# Patient Record
Sex: Female | Born: 1945 | Race: White | Hispanic: No | Marital: Married | Smoking: Never smoker
Health system: Southern US, Academic
[De-identification: ages and names within clinical notes are randomized; demographics above are authoritative.]

## PROBLEM LIST (undated history)

## (undated) DIAGNOSIS — H9041 Sensorineural hearing loss, unilateral, right ear, with unrestricted hearing on the contralateral side: Secondary | ICD-10-CM

## (undated) DIAGNOSIS — R002 Palpitations: Secondary | ICD-10-CM

## (undated) DIAGNOSIS — M4180 Other forms of scoliosis, site unspecified: Secondary | ICD-10-CM

## (undated) DIAGNOSIS — I10 Essential (primary) hypertension: Secondary | ICD-10-CM

## (undated) DIAGNOSIS — I361 Nonrheumatic tricuspid (valve) insufficiency: Secondary | ICD-10-CM

## (undated) DIAGNOSIS — M545 Low back pain, unspecified: Secondary | ICD-10-CM

## (undated) DIAGNOSIS — I7300 Raynaud's syndrome without gangrene: Secondary | ICD-10-CM

## (undated) DIAGNOSIS — G8929 Other chronic pain: Secondary | ICD-10-CM

## (undated) DIAGNOSIS — I471 Supraventricular tachycardia, unspecified: Secondary | ICD-10-CM

## (undated) DIAGNOSIS — M810 Age-related osteoporosis without current pathological fracture: Secondary | ICD-10-CM

## (undated) DIAGNOSIS — K589 Irritable bowel syndrome without diarrhea: Secondary | ICD-10-CM

## (undated) DIAGNOSIS — Z681 Body mass index (BMI) 19 or less, adult: Secondary | ICD-10-CM

## (undated) DIAGNOSIS — E039 Hypothyroidism, unspecified: Secondary | ICD-10-CM

## (undated) DIAGNOSIS — M4310 Spondylolisthesis, site unspecified: Secondary | ICD-10-CM

## (undated) DIAGNOSIS — R350 Frequency of micturition: Secondary | ICD-10-CM

## (undated) DIAGNOSIS — M8580 Other specified disorders of bone density and structure, unspecified site: Secondary | ICD-10-CM

## (undated) DIAGNOSIS — E539 Vitamin B deficiency, unspecified: Secondary | ICD-10-CM

## (undated) DIAGNOSIS — M542 Cervicalgia: Secondary | ICD-10-CM

## (undated) HISTORY — DX: Other chronic pain: G89.29

## (undated) HISTORY — DX: Supraventricular tachycardia, unspecified: I47.10

## (undated) HISTORY — DX: Raynaud's syndrome without gangrene: I73.00

## (undated) HISTORY — DX: Low back pain, unspecified: M54.50

## (undated) HISTORY — DX: Vitamin B deficiency, unspecified: E53.9

## (undated) HISTORY — DX: Other forms of scoliosis, site unspecified: M41.80

## (undated) HISTORY — DX: Cervicalgia: M54.2

## (undated) HISTORY — DX: Frequency of micturition: R35.0

## (undated) HISTORY — PX: HX HYSTERECTOMY: SHX81

## (undated) HISTORY — DX: Sensorineural hearing loss, unilateral, right ear, with unrestricted hearing on the contralateral side: H90.41

## (undated) HISTORY — DX: Other specified disorders of bone density and structure, unspecified site: M85.80

## (undated) HISTORY — DX: Palpitations: R00.2

## (undated) HISTORY — DX: Spondylolisthesis, site unspecified: M43.10

## (undated) HISTORY — DX: Supraventricular tachycardia (CMS HCC): I47.1

## (undated) HISTORY — DX: Irritable bowel syndrome, unspecified: K58.9

## (undated) HISTORY — PX: HX HIP REPLACEMENT: SHX124

## (undated) HISTORY — DX: Age-related osteoporosis without current pathological fracture: M81.0

## (undated) HISTORY — DX: Body mass index (BMI) 19.9 or less, adult: Z68.1

## (undated) HISTORY — DX: Nonrheumatic tricuspid (valve) insufficiency: I36.1

## (undated) HISTORY — PX: HX APPENDECTOMY: SHX54

## (undated) HISTORY — PX: COLONOSCOPY: SHX174

## (undated) HISTORY — DX: Essential (primary) hypertension: I10

## (undated) HISTORY — DX: Hypothyroidism, unspecified: E03.9

---

## 1996-09-29 ENCOUNTER — Other Ambulatory Visit (HOSPITAL_COMMUNITY): Payer: Self-pay

## 2020-11-14 IMAGING — MR MRI HIP RT W/O CONTRAST
13 series · 40 of 40 positions shown · IV contrast (gadolinium)
Comparison: None available.

﻿EXAM:  [id number]   MRI HIP RT W/O CONTRAST
INDICATION: Right hip pain.
TECHNIQUE: Multiplanar multisequential MRI of the pelvis and right hip joint was performed without gadolinium contrast.

[Series 4: axial shim · axial · 10.0mm · 3.12mm/px · z∈[-100,+100]mm · 3 of 21 slices shown (1 of 4)]
[im 1/21]
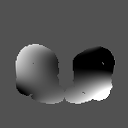
[im 11/21]
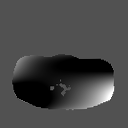
[im 21/21]
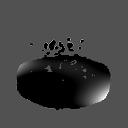

[Series 5: axial shim · axial · 10.0mm · 3.12mm/px · z∈[-100,+100]mm · 2 of 21 slices shown (2 of 4)]
[im 1/21]
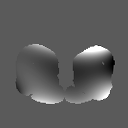
[im 21/21]
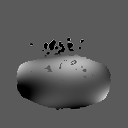

[Series 6: s-map (pelvis) · axial · 14.1mm · 7.03mm/px · z∈[-247,+247]mm · 7 of 72 slices shown (1 of 2)]
[im 1/72]
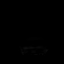
[im 12/72]
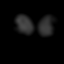
[im 24/72]
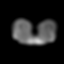
[im 36/72]
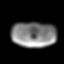
[im 48/72]
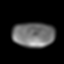
[im 60/72]
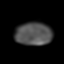
[im 72/72]
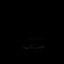

[Series 7: T2 fat-sat · axial · 5.0mm · 0.80mm/px · z∈[-84,+76]mm · 3 of 30 slices shown (1 of 3)]
[im 1/30]
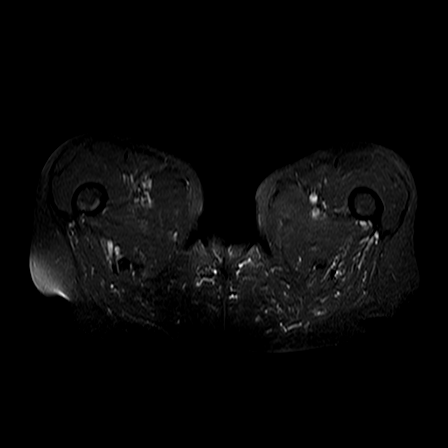
[im 15/30]
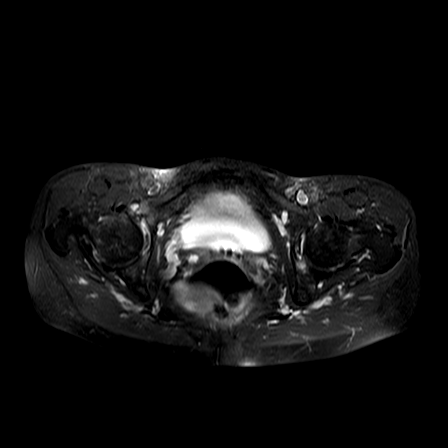
[im 30/30]
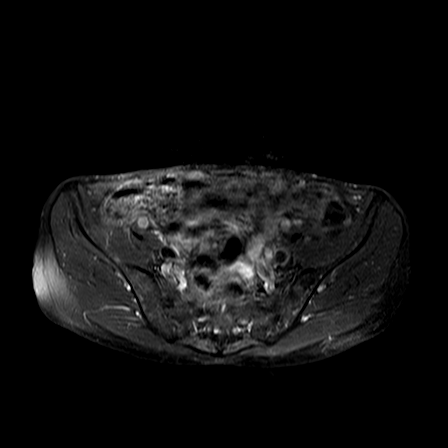

[Series 8: T1 · axial · 5.0mm · 0.70mm/px · z∈[-84,+76]mm · 3 of 30 slices shown (1 of 3)]
[im 1/30]
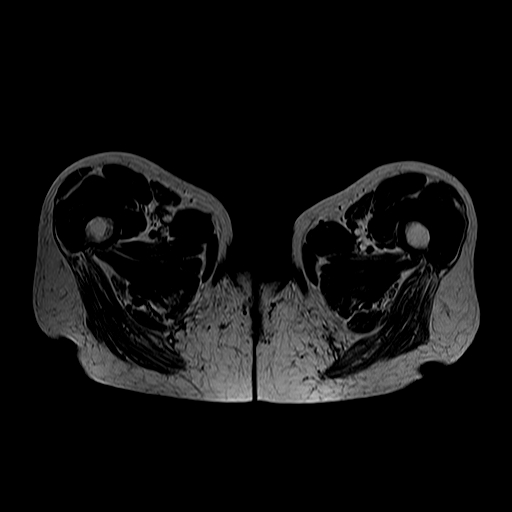
[im 15/30]
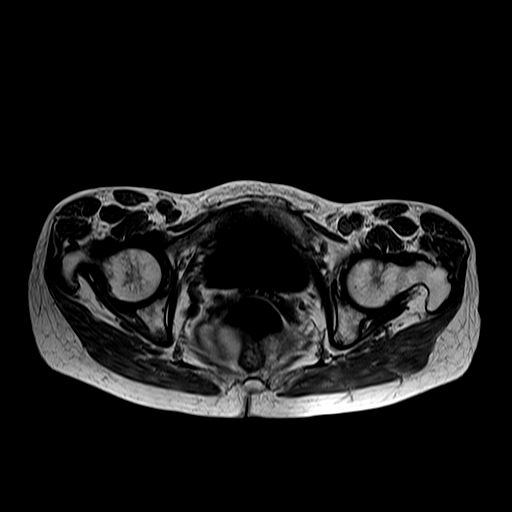
[im 30/30]
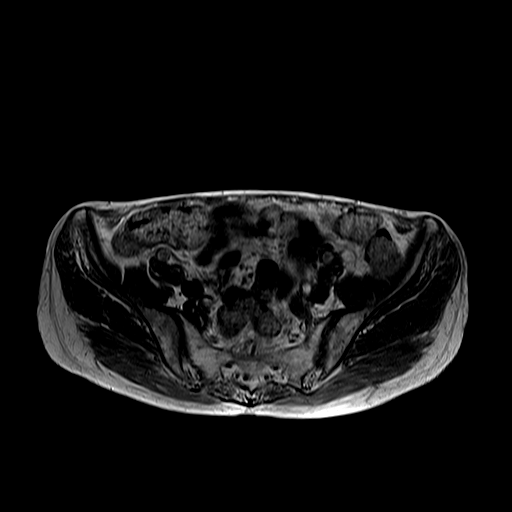

[Series 9: STIR · axial · 5.0mm · 0.70mm/px · z∈[-84,+76]mm · 3 of 30 slices shown]
[im 1/30]
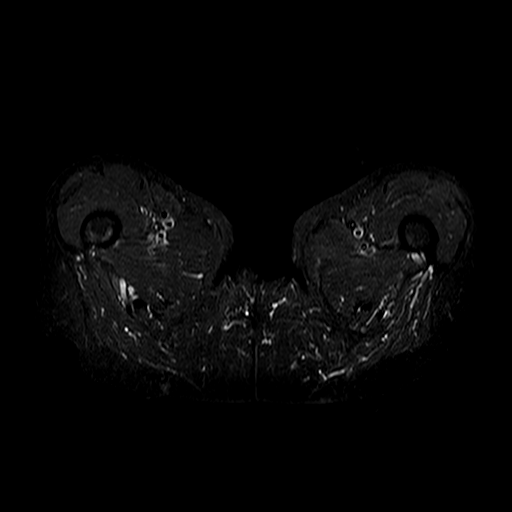
[im 15/30]
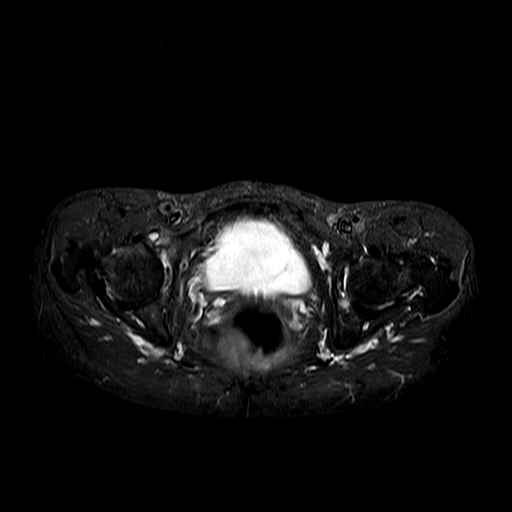
[im 30/30]
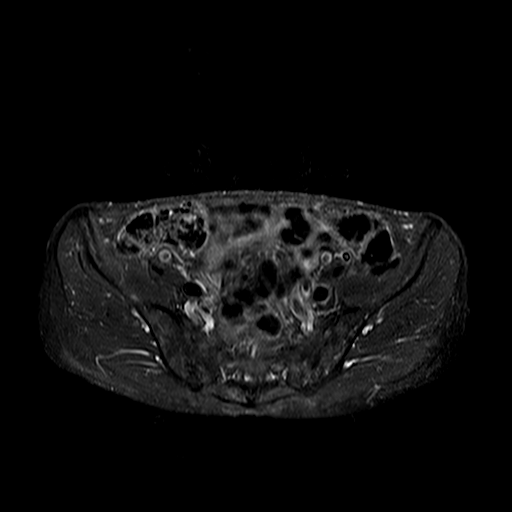

[Series 10: T1 · coronal · 4.0mm · 0.78mm/px · 2 of 20 slices shown (2 of 3)]
[im 1/20]
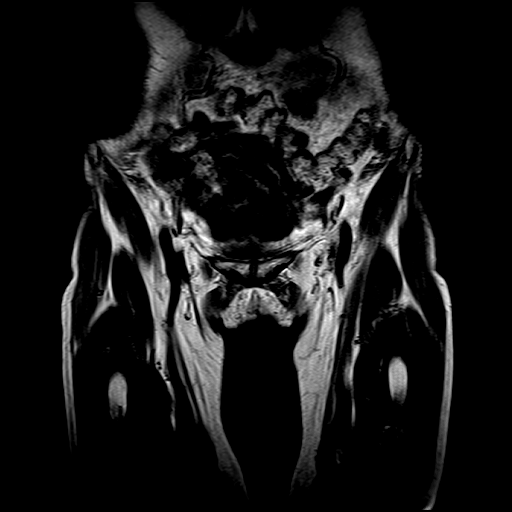
[im 20/20]
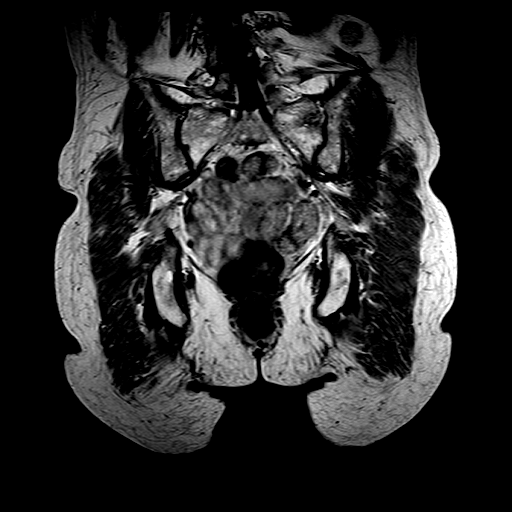

[Series 11: T2 fat-sat · coronal · 4.0mm · 0.78mm/px · 2 of 20 slices shown (2 of 3)]
[im 1/20]
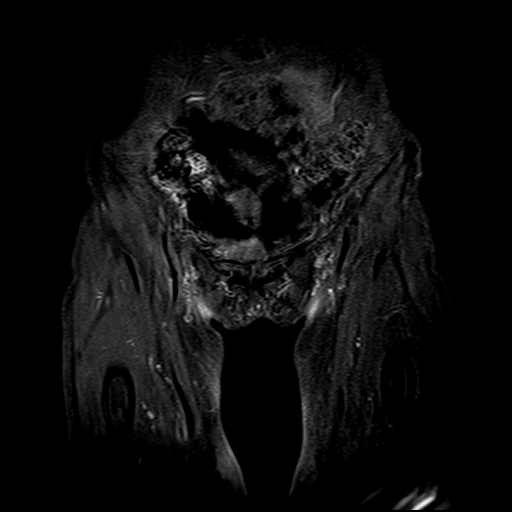
[im 20/20]
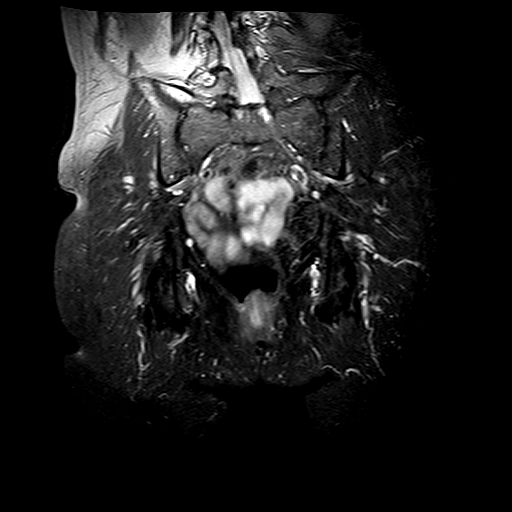

[Series 12: axial shim · axial · 10.0mm · 3.12mm/px · z∈[-100,+100]mm · 2 of 21 slices shown (3 of 4)]
[im 1/21]
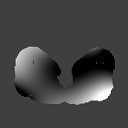
[im 21/21]
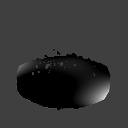

[Series 13: axial shim · axial · 10.0mm · 3.12mm/px · z∈[-100,+100]mm · 2 of 21 slices shown (4 of 4)]
[im 1/21]
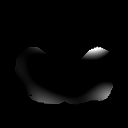
[im 21/21]
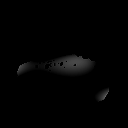

[Series 14: s-map (pelvis) · axial · 14.1mm · 7.03mm/px · z∈[-247,+247]mm · 7 of 72 slices shown (2 of 2)]
[im 1/72]
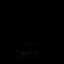
[im 12/72]
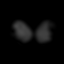
[im 24/72]
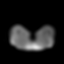
[im 36/72]
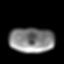
[im 48/72]
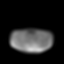
[im 60/72]
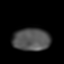
[im 72/72]
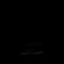

[Series 15: T1 · sagittal · 5.0mm · 0.62mm/px · 2 of 20 slices shown (3 of 3)]
[im 1/20]
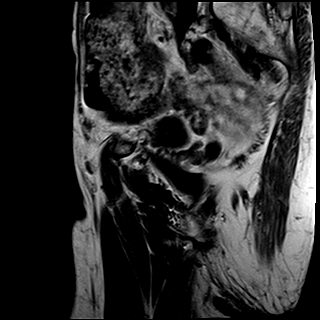
[im 20/20]
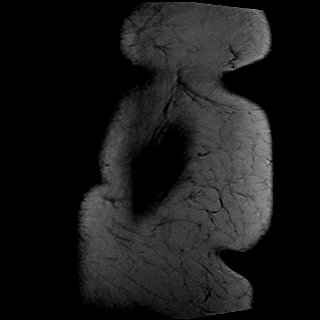

[Series 16: T2 fat-sat · sagittal · 4.5mm · 0.78mm/px · 2 of 20 slices shown (3 of 3)]
[im 1/20]
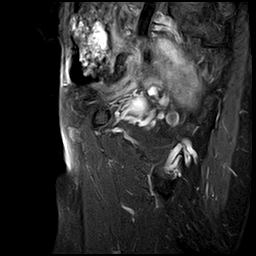
[im 20/20]
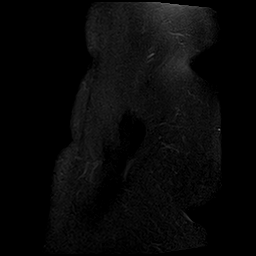

[40 of 40 positions shown; findings below may reference images not displayed]

FINDINGS: Bone marrow signal intensity is normal. There is no acute fracture or subluxation. Near complete loss of articular cartilage is seen within the right hip with extensive subarticular cystic changes within the superior and anterior right acetabulum. Moderate edema is also noted within the posterior acetabulum. There is no significant hip effusion on either side. There is no iliopsoas or greater trochanteric bursitis. Muscles and soft tissues about the pelvic girdle appear unremarkable. Evaluation of pelvic parenchymal structures is limited without definite abnormality.
IMPRESSION: Osteoarthritis of the right hip joint as detailed above.

## 2020-11-27 IMAGING — MR MRI CERVICAL SPINE WITHOUT CONTRAST
4 of 5 series · 23 of 48 positions shown · non-contrast
Comparison: None previous.

﻿EXAM:  [id number]   MRI CERVICAL SPINE WITHOUT CONTRAST
INDICATION: 75-year-old with persistent neck pain and stiffness.  No prior history of cervical spine surgery.
TECHNIQUE: Axial, coronal and sagittal images including T1, T2 and STIR sequences following standard protocol.

[Series 5: T2 · sagittal · 3.0mm · 0.75mm/px · 8 of 15 slices shown (1 of 2)]
[im 1/15]
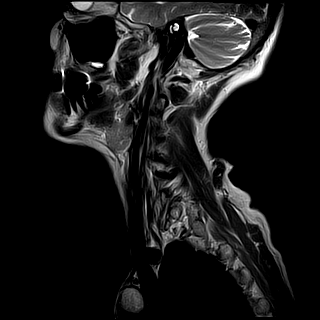
[im 3/15]
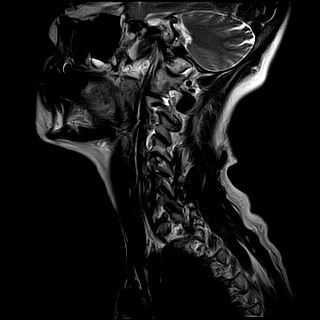
[im 5/15]
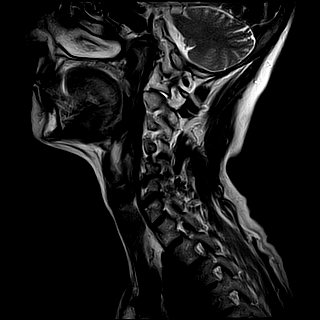
[im 7/15]
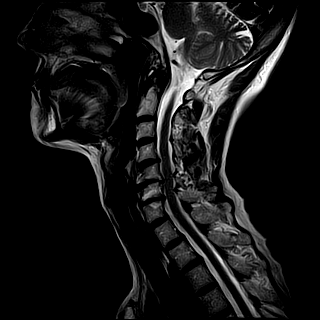
[im 9/15]
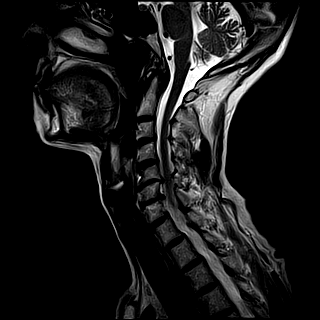
[im 11/15]
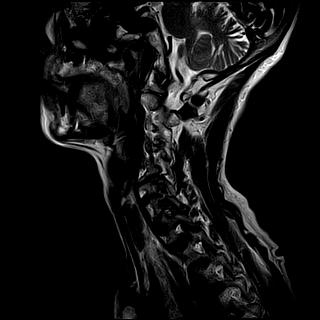
[im 13/15]
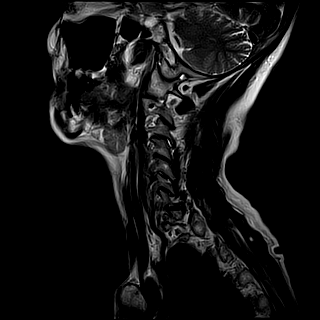
[im 15/15]
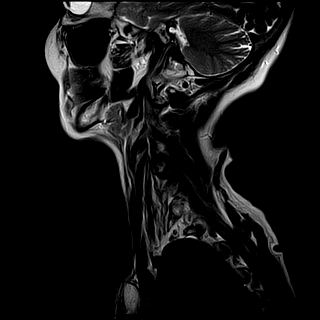

[Series 6: T1 · sagittal · 3.0mm · 0.47mm/px · 3 of 15 slices shown]
[im 2/15]
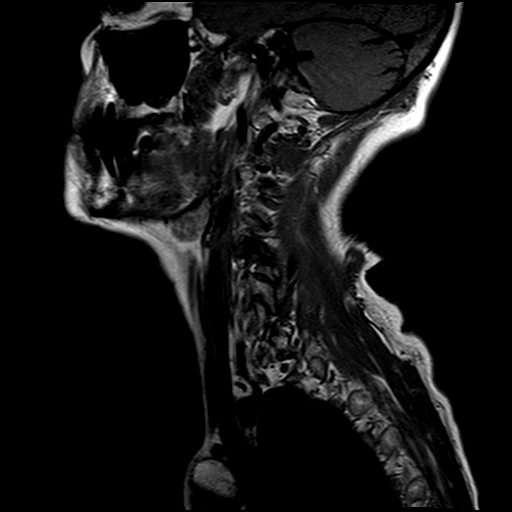
[im 8/15]
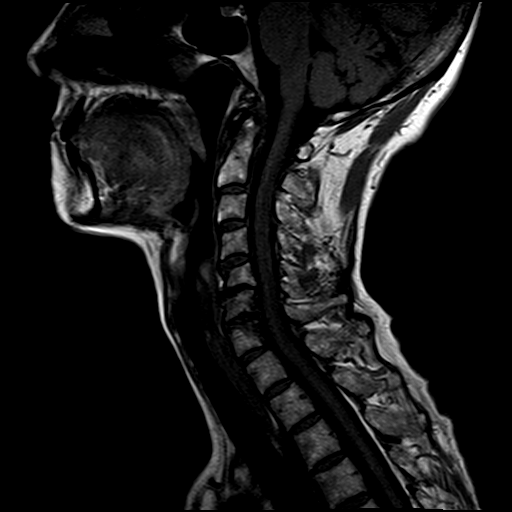
[im 13/15]
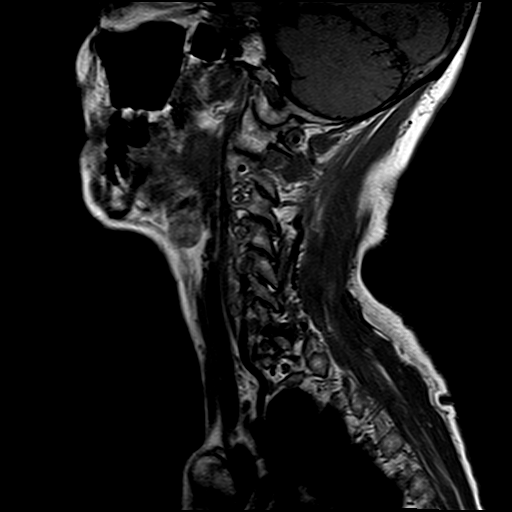

[Series 8: STIR · sagittal · 3.0mm · 0.47mm/px · 3 of 15 slices shown]
[im 2/15]
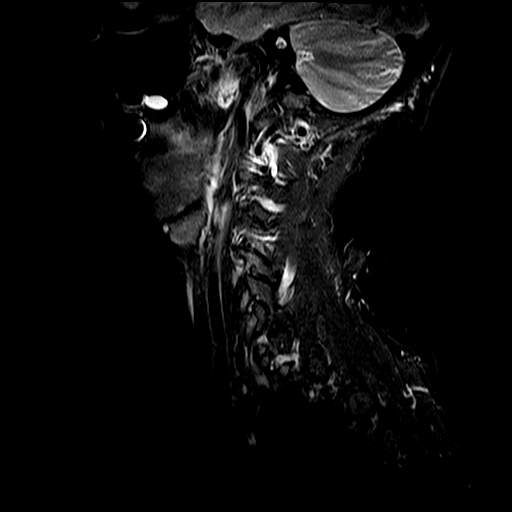
[im 8/15]
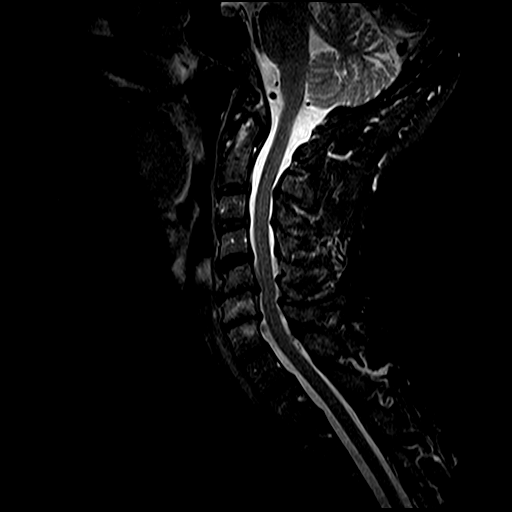
[im 13/15]
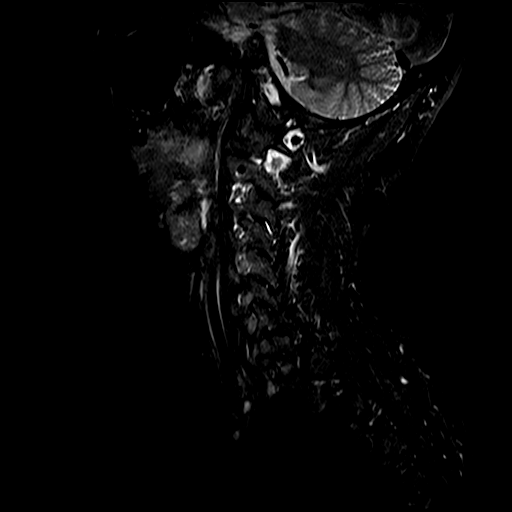

[Series 10: T2 · axial · 3.0mm · 0.39mm/px · z∈[-106,+4]mm · 9 of 18 slices shown (2 of 2)]
[im 1/18]
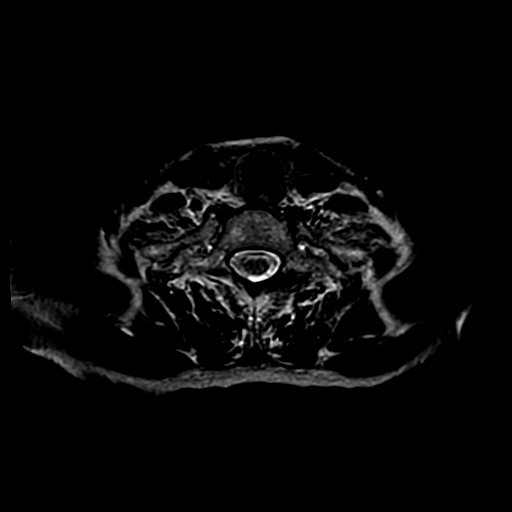
[im 2/18]
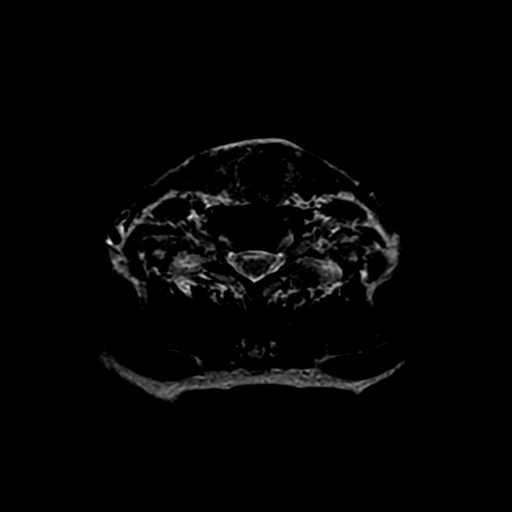
[im 4/18]
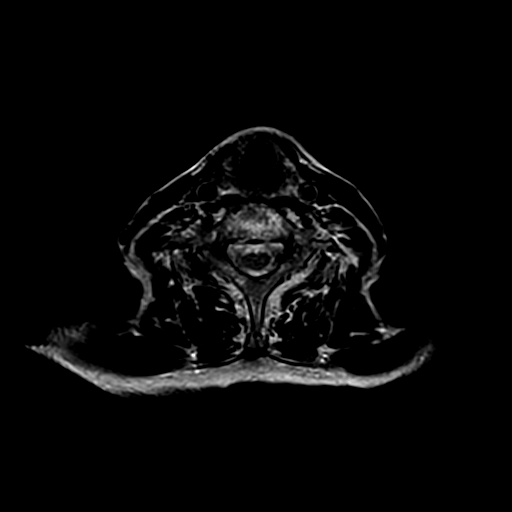
[im 6/18]
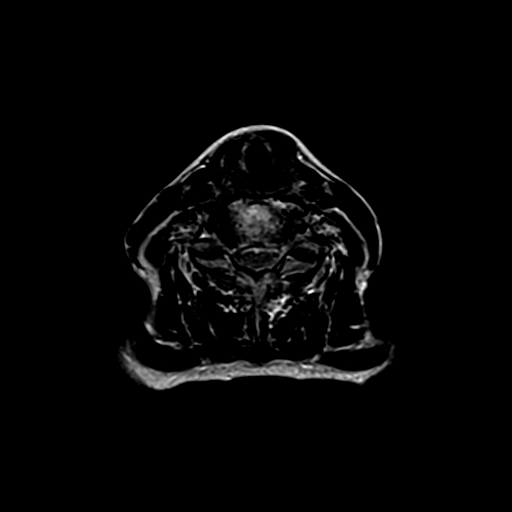
[im 7/18]
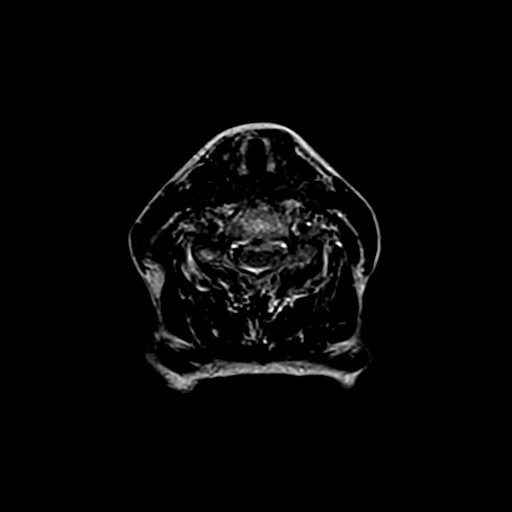
[im 9/18]
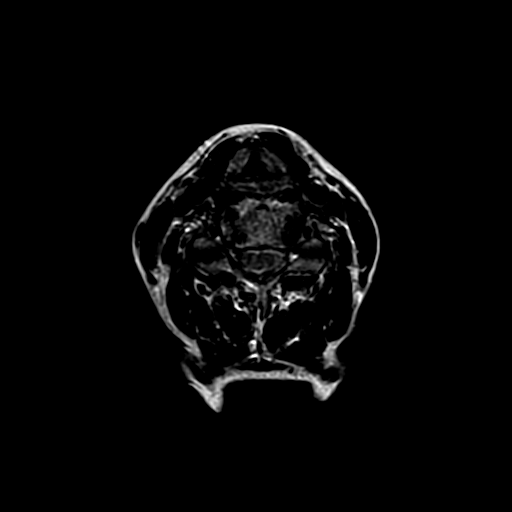
[im 11/18]
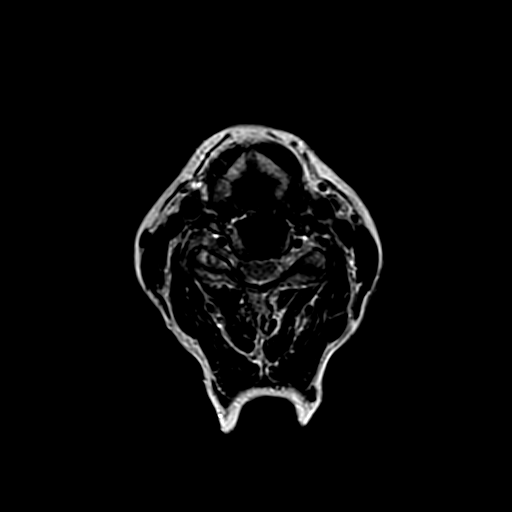
[im 12/18]
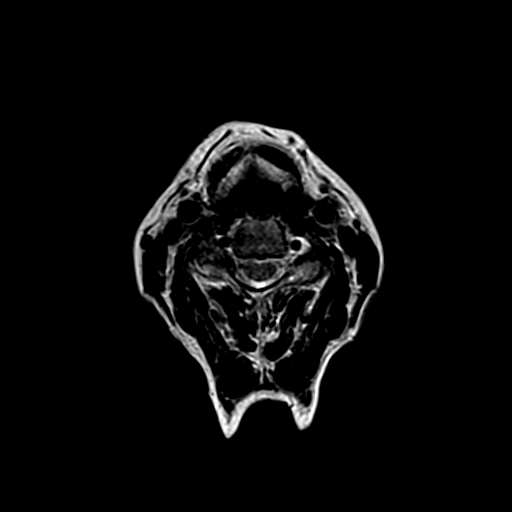
[im 16/18]
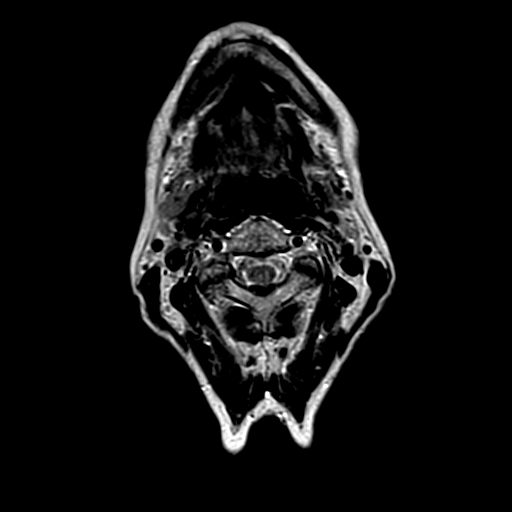

[23 of 48 positions shown; findings below may reference images not displayed]

FINDINGS: No acute bony lesions of cervical vertebrae are seen.  Posterior fossa and foramen magnum structures are normal in sagittal projection. 

At C2-C3 level, no focal disc lesions are seen. 

At C3-C4 level, mild left foraminal narrowing due to degenerative disc osteophyte complex is noted.  

At C4-C5 level, mild right foraminal narrowing due to osteophyte complex is noted.  

At C5-C6 level, prominent disc osteophyte complex on the left side is noted causing significant compromise of left neural foramen impinging on the left C6 nerve root.  Mild compromise of thecal sac is noted in the midline at this level with AP diameter measuring 7.5 mm.  

At C6-C7 level, significant degenerative disc disease is noted with Modic type I inflammatory changes on both sides.  Disc osteophyte complex is causing compromise of both neural foramina at C6-C7 level, right more than the left.  

At C7-T1 level, no focal disc lesions are seen.  Cervical spinal cord shows no focal abnormalities.  Paravertebral soft tissues are unremarkable.
IMPRESSION: 1. No acute bone changes of cervical vertebrae. 

2. At C5-C6 level, prominent disc osteophyte complex on the left side is noted causing significant compromise of left neural foramen impinging on the left C6 nerve root.  Mild compromise of thecal sac is noted in the midline at this level with AP diameter measuring 7.5 mm.  

3. At C6-C7 level, significant degenerative disc disease is noted with Modic type I inflammatory changes on both sides.  Disc osteophyte complex is causing compromise of both neural foramina at C6-C7 level, right more than the left.  

4. No focal lesions of the cervical cord are seen.

## 2021-09-24 ENCOUNTER — Encounter (INDEPENDENT_AMBULATORY_CARE_PROVIDER_SITE_OTHER): Payer: Self-pay | Admitting: Family Medicine

## 2021-09-24 DIAGNOSIS — M542 Cervicalgia: Secondary | ICD-10-CM | POA: Insufficient documentation

## 2021-09-24 DIAGNOSIS — M50122 Cervical disc disorder at C5-C6 level with radiculopathy: Secondary | ICD-10-CM | POA: Insufficient documentation

## 2021-09-24 DIAGNOSIS — M7542 Impingement syndrome of left shoulder: Secondary | ICD-10-CM | POA: Insufficient documentation

## 2021-09-24 DIAGNOSIS — M50123 Cervical disc disorder at C6-C7 level with radiculopathy: Secondary | ICD-10-CM

## 2021-10-04 ENCOUNTER — Other Ambulatory Visit: Payer: Self-pay

## 2021-10-04 ENCOUNTER — Other Ambulatory Visit: Payer: Commercial Managed Care - PPO | Attending: Family Medicine | Admitting: Family Medicine

## 2021-10-04 ENCOUNTER — Ambulatory Visit (INDEPENDENT_AMBULATORY_CARE_PROVIDER_SITE_OTHER): Payer: Commercial Managed Care - PPO

## 2021-10-04 ENCOUNTER — Telehealth (INDEPENDENT_AMBULATORY_CARE_PROVIDER_SITE_OTHER): Payer: Self-pay | Admitting: Family Medicine

## 2021-10-04 DIAGNOSIS — E785 Hyperlipidemia, unspecified: Secondary | ICD-10-CM

## 2021-10-04 DIAGNOSIS — R399 Unspecified symptoms and signs involving the genitourinary system: Secondary | ICD-10-CM | POA: Insufficient documentation

## 2021-10-04 DIAGNOSIS — I10 Essential (primary) hypertension: Secondary | ICD-10-CM | POA: Insufficient documentation

## 2021-10-04 LAB — URINALYSIS, MACROSCOPIC
BILIRUBIN: NEGATIVE mg/dL
BLOOD: NEGATIVE mg/dL
GLUCOSE: NEGATIVE mg/dL
KETONES: NEGATIVE mg/dL
LEUKOCYTES: NEGATIVE WBCs/uL
NITRITE: NEGATIVE
PH: 7 (ref 5.0–9.0)
PROTEIN: NEGATIVE mg/dL
SPECIFIC GRAVITY: 1.014 (ref 1.002–1.030)
UROBILINOGEN: NORMAL mg/dL

## 2021-10-04 LAB — LIPID PANEL
CHOL/HDL RATIO: 2.1
CHOLESTEROL: 177 mg/dL (ref 136–290)
HDL CHOL: 85 mg/dL (ref 23–92)
LDL CALC: 75 mg/dL (ref 0–100)
TRIGLYCERIDES: 83 mg/dL (ref <=150)
VLDL CALC: 17 mg/dL (ref 0–50)

## 2021-10-04 LAB — BASIC METABOLIC PANEL
ANION GAP: 6 mmol/L — ABNORMAL LOW (ref 10–20)
BUN/CREA RATIO: 23 — ABNORMAL HIGH (ref 6–22)
BUN: 19 mg/dL (ref 7–25)
CALCIUM: 9.8 mg/dL (ref 8.6–10.3)
CHLORIDE: 104 mmol/L (ref 98–107)
CO2 TOTAL: 29 mmol/L (ref 21–31)
CREATININE: 0.84 mg/dL (ref 0.60–1.30)
ESTIMATED GFR: 73 mL/min/{1.73_m2} (ref >59)
GLUCOSE: 96 mg/dL (ref 74–109)
OSMOLALITY, CALCULATED: 280 mOsm/kg (ref 270–290)
POTASSIUM: 4.4 mmol/L (ref 3.5–5.1)
SODIUM: 139 mmol/L (ref 136–145)

## 2021-10-04 LAB — CBC
HCT: 42.8 % (ref 37.0–47.0)
HGB: 14.2 g/dL (ref 12.5–16.0)
MCH: 31.4 pg (ref 27.0–32.0)
MCHC: 33 g/dL (ref 32.0–36.0)
MCV: 95.1 fL (ref 78.0–99.0)
MPV: 8.2 fL (ref 7.4–10.4)
PLATELETS: 298 10*3/uL (ref 140–440)
RBC: 4.51 10*6/uL (ref 4.20–5.40)
RDW: 13.2 % (ref 11.6–14.8)
WBC: 5.1 10*3/uL (ref 4.0–10.5)
WBCS UNCORRECTED: 5.1 10*3/uL

## 2021-10-04 LAB — URINALYSIS, MICROSCOPIC: WBCS: 1 /hpf (ref <6)

## 2021-10-04 LAB — HEPATIC FUNCTION PANEL
ALBUMIN/GLOBULIN RATIO: 1.4 (ref 0.8–1.4)
ALBUMIN: 4.3 g/dL (ref 3.5–5.7)
ALKALINE PHOSPHATASE: 94 U/L (ref 34–104)
ALT (SGPT): 25 U/L (ref 7–52)
AST (SGOT): 32 U/L (ref 13–39)
BILIRUBIN DIRECT: 0.2 md/dL (ref <=0.20)
BILIRUBIN TOTAL: 1.2 mg/dL (ref 0.3–1.2)
BILIRUBIN, INDIRECT: 1 mg/dL (ref <=1)
GLOBULIN: 3 (ref 2.9–5.4)
PROTEIN TOTAL: 7.3 g/dL (ref 6.4–8.9)

## 2021-10-04 LAB — THYROID STIMULATING HORMONE (SENSITIVE TSH): TSH: 2.084 u[IU]/mL (ref 0.450–5.330)

## 2021-10-04 NOTE — Telephone Encounter (Addendum)
Has CDM on 10/05/21 - discuss labs then          ----- Message from Mickey Farber, DO sent at 10/04/2021  1:19 PM EST -----  Call patient let her know her labs look good and there does not appear to be any infection in her urine

## 2021-10-05 ENCOUNTER — Ambulatory Visit (INDEPENDENT_AMBULATORY_CARE_PROVIDER_SITE_OTHER): Payer: Commercial Managed Care - PPO | Admitting: Family Medicine

## 2021-10-05 ENCOUNTER — Encounter (INDEPENDENT_AMBULATORY_CARE_PROVIDER_SITE_OTHER): Payer: Self-pay | Admitting: Family Medicine

## 2021-10-05 VITALS — BP 128/80 | HR 67 | Temp 98.5°F | Ht 66.0 in | Wt 103.0 lb

## 2021-10-05 DIAGNOSIS — M810 Age-related osteoporosis without current pathological fracture: Secondary | ICD-10-CM

## 2021-10-05 DIAGNOSIS — G8929 Other chronic pain: Secondary | ICD-10-CM

## 2021-10-05 DIAGNOSIS — I471 Supraventricular tachycardia: Secondary | ICD-10-CM

## 2021-10-05 DIAGNOSIS — Z681 Body mass index (BMI) 19 or less, adult: Secondary | ICD-10-CM

## 2021-10-05 DIAGNOSIS — M545 Low back pain, unspecified: Secondary | ICD-10-CM

## 2021-10-05 DIAGNOSIS — E559 Vitamin D deficiency, unspecified: Secondary | ICD-10-CM

## 2021-10-05 DIAGNOSIS — E538 Deficiency of other specified B group vitamins: Secondary | ICD-10-CM

## 2021-10-05 DIAGNOSIS — E039 Hypothyroidism, unspecified: Secondary | ICD-10-CM | POA: Insufficient documentation

## 2021-10-05 DIAGNOSIS — I10 Essential (primary) hypertension: Secondary | ICD-10-CM

## 2021-10-05 NOTE — Progress Notes (Signed)
FAMILY MEDICINE, MEDICAL OFFICE BUILDING  [address]  [place] [address]       Name: Angela Benson MRN:  [id number]   Date: 10/05/2021 Age: 76 y.o.          Provider: Mickey Farber, DO    Reason for visit: Follow Up 6 Months      History of Present Illness:  10/05/2021:  This 76 year old female presents for 6 month follow-up to review her labs and get refills on all of her medications.  Her blood pressure is excellent 128/80.  She does have a chronic cough and she is on an ACE-inhibitor and I suggest we change to another medication but she did 1 change at this time.  She denies chest pain or shortness of breath.  She is now going to the fitness center and swimming on a regular basis.  She also is working at [employer].  She has a history of Raynaud's disease and has some redness around her fingers but no red-white bleed changes with the cold.  She has not gained any weight since she was here last.  Her liver enzymes were normal urinalysis normal TSH was 2.084 CBC was normal electrolytes normal with a glucose of 96 BUN 19 creatinine 0.84 her calcium is normal 9.8 GFR was normal.  Total cholesterol was 177 triglycerides 83 HDL 85 and LDL 75.  She also has a sore right great toe had in the past at but today it is okay.  Historical Data    Past Medical History:  Past Medical History:   Diagnosis Date   . Acquired hypothyroidism    . B-complex deficiency    . Body mass index (BMI) of 19.9 or less in adult    . Chronic neck pain    . Dextroscoliosis    . Essential hypertension    . Irritable bowel syndrome    . Low back pain    . Midline low back pain without sciatica    . Non-rheumatic tricuspid valve insufficiency    . Osteopenia    . Osteoporosis    . Palpitations    . Raynaud's syndrome    . Retrolisthesis of vertebrae    . Sensorineural hearing loss (SNHL) of right ear with unrestricted hearing of left ear    . SVT (supraventricular tachycardia) (CMS HCC)    . Urinary frequency          Past  Surgical History:  Past Surgical History:   Procedure Laterality Date   . COLONOSCOPY     . HX APPENDECTOMY     . HX HYSTERECTOMY           Allergies:  Allergies   Allergen Reactions   . Nitrofurantoin Hives/ Urticaria     Flu-like symptoms/hives   . Penicillins Hives/ Urticaria     Hives/rash     Medications:  Current Outpatient Medications   Medication Sig   . Ca carb-Ca gluc-Mg ox-Mg gluco (CALCIUM MAGNESIUM) 500 mg calcium -250 mg Oral Tablet Take 1 Tablet by mouth Once a day   . cholecalciferol, vitamin D3, 50 mcg (2,000 unit) Oral Tablet Take 1 Tablet (2,000 Units total) by mouth Once a day   . coenzyme Q10 (CO Q-10) 100 mg Oral Capsule Take 1 Capsule (100 mg total) by mouth Once a day   . cyanocobalamin (VITAMIN B 12) 1,000 mcg Oral Tablet Take 1 Tablet (1,000 mcg total) by mouth Once a day   . levothyroxine (SYNTHROID) 50 mcg Oral  Tablet Take 1 Tablet (50 mcg total) by mouth Every morning   . lisinopriL (PRINIVIL) 20 mg Oral Tablet Take 1 Tablet (20 mg total) by mouth Once a day     Family History:  Family Medical History:     Problem Relation (Age of Onset)    Arthritis-osteo Father    Congestive Heart Failure Mother    Dementia Mother    Diabetes type II Father    Elevated Lipids Father    Heart Attack Father    Hypertension (High Blood Pressure) Mother, Father    Melanoma Father          Social History:  Social History     Socioeconomic History   . Marital status: Married   Tobacco Use   . Smoking status: Never     Passive exposure: Never   . Smokeless tobacco: Never   Vaping Use   . Vaping Use: Never used           Review of Systems:  Any pertinent Review of Systems as addressed in the HPI above.    Physical Exam:  Vital Signs:  Vitals:    10/05/21 1101   BP: 128/80   Pulse: 67   Temp: 36.9 C (98.5 F)   TempSrc: Skin   SpO2: 95%   Weight: 46.7 kg (103 lb)   Height: 1.676 m (5\' 6" )   BMI: 16.66     Physical Exam  Constitutional:       Appearance: Normal appearance. She is normal weight.   HENT:       Head: Normocephalic and atraumatic.      Right Ear: Tympanic membrane, ear canal and external ear normal.      Left Ear: Tympanic membrane, ear canal and external ear normal.      Nose: Nose normal.      Mouth/Throat:      Mouth: Mucous membranes are moist.      Pharynx: Oropharynx is clear.   Eyes:      Extraocular Movements: Extraocular movements intact.      Conjunctiva/sclera: Conjunctivae normal.      Pupils: Pupils are equal, round, and reactive to light.   Cardiovascular:      Rate and Rhythm: Normal rate and regular rhythm.   Pulmonary:      Effort: Pulmonary effort is normal.      Breath sounds: Normal breath sounds.   Abdominal:      General: Abdomen is flat. Bowel sounds are normal.      Palpations: Abdomen is soft.   Musculoskeletal:         General: Normal range of motion.      Cervical back: Normal range of motion and neck supple.   Skin:     General: Skin is warm and dry.      Capillary Refill: Capillary refill takes less than 2 seconds.   Neurological:      General: No focal deficit present.      Mental Status: She is alert and oriented to person, place, and time. Mental status is at baseline.   Psychiatric:         Mood and Affect: Mood normal.         Behavior: Behavior normal.         Thought Content: Thought content normal.         Judgment: Judgment normal.       Assessment:    ICD-10-CM    1. Acquired hypothyroidism  E03.9  2. Essential hypertension  I10 LIPID PANEL     HEPATIC FUNCTION PANEL     CBC/DIFF     URINALYSIS, MACROSCOPIC AND MICROSCOPIC W/CULTURE REFLEX     BASIC METABOLIC PANEL     THYROID STIMULATING HORMONE (SENSITIVE TSH)      3. Age related osteoporosis, unspecified pathological fracture presence  M81.0       4. SVT (supraventricular tachycardia) (CMS HCC)  I47.1       5. Chronic midline low back pain without sciatica  M54.50 C-REACTIVE PROTEIN(CRP),INFLAMMATION    G89.29       6. Body mass index (BMI) of 19.9 or less in adult  Z68.1       7. B12 deficiency  E53.8 VITAMIN  B12      8. Vitamin D deficiency  E55.9 VITAMIN D 25 TOTAL         Plan:  Orders Placed This Encounter   . LIPID PANEL   . HEPATIC FUNCTION PANEL   . CBC/DIFF   . URINALYSIS, MACROSCOPIC AND MICROSCOPIC W/CULTURE REFLEX   . BASIC METABOLIC PANEL   . THYROID STIMULATING HORMONE (SENSITIVE TSH)   . VITAMIN D 25 TOTAL   . VITAMIN B12   . C-REACTIVE PROTEIN(CRP),INFLAMMATION     Today for 6 months from now I would lipid panel hepatic function panel CBC with diff urinalysis microscopic microscopic basic metabolic panel thyroid-stimulating hormone vitamin-D B12 and CRP.  The patient feels like she has inflammation in her body.  She states that no matter what she does she can not gain weight but she stays extremely active at all times and really does not eat a whole lot of fast food or high carb foods.  Her back pain is still there and she has been using Forensic psychologist which helps much better than that she gets over-the-counter.  Her blood pressure stable we will check vitamin-D and vitamin B12 level in the future thyroid is well controlled and stable she has a history of SVT but not today.  More than 50% of visit was spent counseling and coordinating patient care.  All questions were answered to the satisfaction of the patient.    Return in about 6 months (around 04/07/2022).    Mickey Farber, DO     Portions of this note may be dictated using voice recognition software or a dictation service. Variances in spelling and vocabulary are possible and unintentional. Not all errors are caught/corrected. Please notify the [facility] if any discrepancies are noted or if the meaning of any statement is not clear.

## 2021-10-12 ENCOUNTER — Other Ambulatory Visit: Payer: Self-pay

## 2021-10-24 ENCOUNTER — Other Ambulatory Visit (INDEPENDENT_AMBULATORY_CARE_PROVIDER_SITE_OTHER): Payer: Self-pay | Admitting: Family Medicine

## 2021-11-19 ENCOUNTER — Other Ambulatory Visit (INDEPENDENT_AMBULATORY_CARE_PROVIDER_SITE_OTHER): Payer: Self-pay | Admitting: Family Medicine

## 2021-12-27 ENCOUNTER — Ambulatory Visit (INDEPENDENT_AMBULATORY_CARE_PROVIDER_SITE_OTHER): Payer: Commercial Managed Care - PPO | Admitting: Family Medicine

## 2021-12-27 ENCOUNTER — Other Ambulatory Visit: Payer: Self-pay

## 2021-12-27 ENCOUNTER — Encounter (INDEPENDENT_AMBULATORY_CARE_PROVIDER_SITE_OTHER): Payer: Self-pay | Admitting: Family Medicine

## 2021-12-27 VITALS — BP 127/84 | HR 74 | Temp 98.9°F | Resp 20 | Ht 66.0 in | Wt 103.6 lb

## 2021-12-27 DIAGNOSIS — M50122 Cervical disc disorder at C5-C6 level with radiculopathy: Secondary | ICD-10-CM

## 2021-12-27 DIAGNOSIS — R0982 Postnasal drip: Secondary | ICD-10-CM

## 2021-12-27 DIAGNOSIS — M50123 Cervical disc disorder at C6-C7 level with radiculopathy: Secondary | ICD-10-CM

## 2021-12-27 DIAGNOSIS — M542 Cervicalgia: Secondary | ICD-10-CM

## 2021-12-27 MED ORDER — LOSARTAN 25 MG TABLET
25.0000 mg | ORAL_TABLET | Freq: Every day | ORAL | 4 refills | Status: DC
Start: 2021-12-27 — End: 2022-04-24

## 2021-12-27 NOTE — Progress Notes (Signed)
FAMILY MEDICINE, MEDICAL OFFICE BUILDING  [place] [address]       Name: Angela Benson MRN:  [id number]   Date: 12/27/2021 Age: 76 y.o.          Provider: Elliot Gault, DO    Reason for visit: Neck Pain      History of Present Illness:  12/27/2021:  This 76 year old female states she has had a sudden onset of new worsened neck pain since last Sunday after working out in the yd.  She states that the pain radiates down the right side of her neck into her right arm causing her to have some weakness in her upper extra extremity on the right.  Her grip is weaker on the right than left.  She thought she was having a heart attack at 1st.  Very sharp pain 3 on a scale 1-10 it takes her breath away she has limited range of motion in her neck.  She has a history at C5-C6 level prominent disc osteophyte complex on the left side causing significant compromise of the left neural foramina impinging on the left C6 nerve root mild compression of the thecal sac is noted in the midline at this level with AP diameter measuring 7.5 mm then at C6-C7 level significant degenerative disc disease is noted with Modic type 1 inflammatory changes on both sides.  Disc osteophyte complexes causing compromise of both neural foramina at C6-C7 level more on the right than left.  This MRI is over a year old.  She also complains of postnasal drip and a thick cough and a throat she has been using Flonase and a Neti pot eye toward start using Mucinex and Coricidin HBP.  She is also concerned that because she has taken an ACE-inhibitor that should be changed to an Arb summer going to change her to 25 mg of losartan.  Will refer her back to physical therapy and were going get a new MRI on her neck.  She will continue her home exercise program and nonsteroidal anti-inflammatories  Historical Data    Past Medical History:  Past Medical History:   Diagnosis Date   . Acquired hypothyroidism    . B-complex deficiency    . Body  mass index (BMI) of 19.9 or less in adult    . Chronic neck pain    . Dextroscoliosis    . Essential hypertension    . Irritable bowel syndrome    . Low back pain    . Midline low back pain without sciatica    . Non-rheumatic tricuspid valve insufficiency    . Osteopenia    . Osteoporosis    . Palpitations    . Raynaud's syndrome    . Retrolisthesis of vertebrae    . Sensorineural hearing loss (SNHL) of right ear with unrestricted hearing of left ear    . SVT (supraventricular tachycardia) (CMS HCC)    . Urinary frequency          Past Surgical History:  Past Surgical History:   Procedure Laterality Date   . COLONOSCOPY     . HX APPENDECTOMY     . HX HYSTERECTOMY           Allergies:  Allergies   Allergen Reactions   . Nitrofurantoin Hives/ Urticaria     Flu-like symptoms/hives   . Penicillins Hives/ Urticaria     Hives/rash     Medications:  Current Outpatient Medications   Medication Sig   .  Ca carb-Ca gluc-Mg ox-Mg gluco (CALCIUM MAGNESIUM) 500 mg calcium -250 mg Oral Tablet Take 1 Tablet by mouth Once a day   . cholecalciferol, vitamin D3, 50 mcg (2,000 unit) Oral Tablet Take 1 Tablet (2,000 Units total) by mouth Once a day   . coenzyme Q10 100 mg Oral Capsule Take 1 Capsule (100 mg total) by mouth Once a day   . cyanocobalamin (VITAMIN B 12) 1,000 mcg Oral Tablet Take 1 Tablet (1,000 mcg total) by mouth Once a day   . levothyroxine (SYNTHROID) 50 mcg Oral Tablet TAKE (1) TABLET DAILY   . lisinopriL (PRINIVIL) 20 mg Oral Tablet TAKE (1) TABLET DAILY   . losartan (COZAAR) 25 mg Oral Tablet Take 1 Tablet (25 mg total) by mouth Once a day     Family History:  Family Medical History:     Problem Relation (Age of Onset)    Arthritis-osteo Father    Congestive Heart Failure Mother    Dementia Mother    Diabetes type II Father    Elevated Lipids Father    Heart Attack Father    Hypertension (High Blood Pressure) Mother, Father    Melanoma Father          Social History:  Social History     Socioeconomic History   .  Marital status: Married   Tobacco Use   . Smoking status: Never     Passive exposure: Never   . Smokeless tobacco: Never   Vaping Use   . Vaping Use: Never used           Review of Systems:  Any pertinent Review of Systems as addressed in the HPI above.    Physical Exam:  Vital Signs:  Vitals:    12/27/21 1421   BP: 127/84   Pulse: 74   Resp: 20   Temp: 37.2 C (98.9 F)   TempSrc: Temporal   SpO2: 96%   Weight: 47 kg (103 lb 9.6 oz)   Height: 1.676 m (5\' 6" )   BMI: 16.76     Physical Exam  Constitutional:       Appearance: Normal appearance. She is normal weight.   HENT:      Head: Normocephalic and atraumatic.      Right Ear: Tympanic membrane, ear canal and external ear normal.      Left Ear: Tympanic membrane, ear canal and external ear normal.      Nose: Nose normal.      Mouth/Throat:      Mouth: Mucous membranes are moist.      Pharynx: Oropharynx is clear.   Eyes:      Extraocular Movements: Extraocular movements intact.      Conjunctiva/sclera: Conjunctivae normal.      Pupils: Pupils are equal, round, and reactive to light.   Cardiovascular:      Rate and Rhythm: Normal rate and regular rhythm.   Pulmonary:      Effort: Pulmonary effort is normal.      Breath sounds: Normal breath sounds.   Abdominal:      General: Abdomen is flat. Bowel sounds are normal.      Palpations: Abdomen is soft.   Musculoskeletal:         General: Normal range of motion.      Cervical back: Normal range of motion and neck supple.   Skin:     General: Skin is warm and dry.      Capillary Refill: Capillary refill takes less than  2 seconds.   Neurological:      General: No focal deficit present.      Mental Status: She is alert and oriented to person, place, and time. Mental status is at baseline.      GCS: GCS eye subscore is 4. GCS verbal subscore is 5. GCS motor subscore is 6.      Cranial Nerves: Cranial nerves 2-12 are intact.      Sensory: Sensory deficit present.      Motor: Weakness present.      Coordination: Romberg sign  negative.      Gait: Gait is intact.      Deep Tendon Reflexes:      Reflex Scores:       Tricep reflexes are 1+ on the right side and 2+ on the left side.       Bicep reflexes are 1+ on the right side and 2+ on the left side.       Brachioradialis reflexes are 1+ on the right side and 2+ on the left side.     Comments: She has decreased sensation to pinprick in the C6 nerve root distribution on the right.  She has weakness in grip in the right hand   Psychiatric:         Mood and Affect: Mood normal.         Behavior: Behavior normal.         Thought Content: Thought content normal.         Judgment: Judgment normal.       Assessment:    ICD-10-CM    1. 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 Neck pain of over 3 months duration  M54.2 MRI SPINE CERVICAL WO CONTRAST         Plan:  Orders Placed This Encounter   . CANCELED: MRI SPINE CERVICAL WO CONTRAST   . MRI SPINE CERVICAL WO CONTRAST   . Referral to [place]   . losartan (COZAAR) 25 mg Oral Tablet     I am going to get MRI of the cervical spine without contrast he may need the also get a thoracic spine MRI.  I will refer the patient to physical therapy and start her on 25 mg of Cozaar for her blood pressure.  She also should be taking her Flonase and Neti pot trying Mucinex also for the postnasal drip and Coricidin HBP for the postnasal drip drainage.  I suspect the patient is going to need to see the neurosurgeon in [place] Dr. Posey Pronto because of her severe neck pain with cervical radiculopathy.  She may benefit from and cervical epidural steroid injections.    Return in about 2 weeks  (around 01/10/2022).    Elliot Gault, DO     Portions of this note may be dictated using voice recognition software or a dictation service. Variances in spelling and vocabulary are possible and unintentional. Not all errors are caught/corrected. Please notify the [facility] if any discrepancies are noted or if the meaning of any statement is not  clear.

## 2022-01-09 IMAGING — MR MRI CERVICAL SPINE WITHOUT CONTRAST
4 of 5 series · 23 of 48 positions shown · IV contrast (gadolinium)
Comparison: MRI dated 11/27/2020.

﻿EXAM:  [id number]   MRI CERVICAL SPINE WITHOUT CONTRAST
INDICATION: Chronic neck pain.
TECHNIQUE: Multiplanar multisequential MRI of the cervical spine was performed without gadolinium contrast.

[Series 5: T2 · sagittal · 3.0mm · 0.75mm/px · 8 of 15 slices shown (1 of 2)]
[im 1/15]
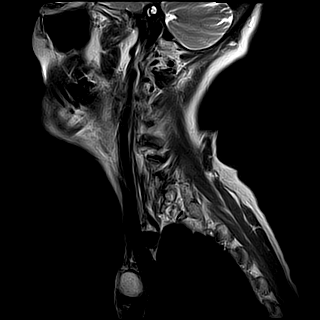
[im 3/15]
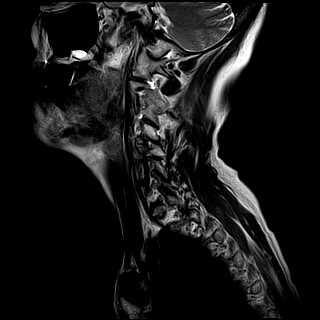
[im 5/15]
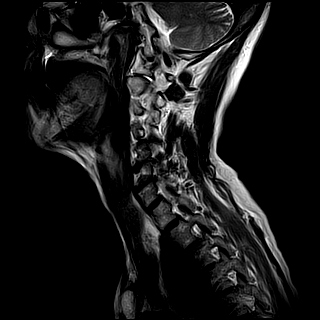
[im 7/15]
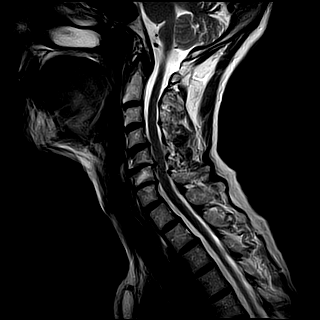
[im 9/15]
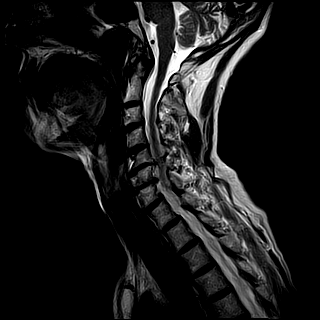
[im 11/15]
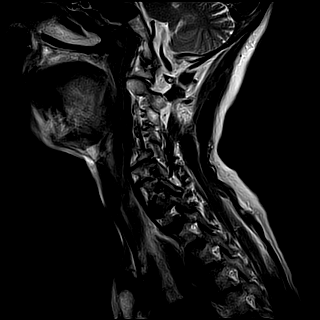
[im 13/15]
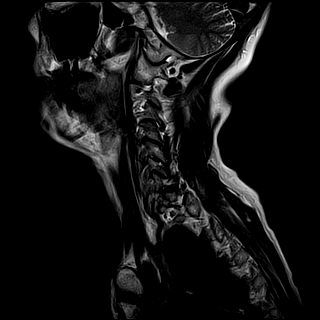
[im 15/15]
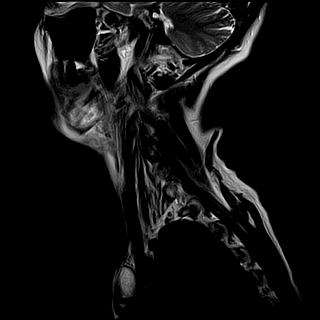

[Series 6: T1 · sagittal · 3.0mm · 0.47mm/px · 3 of 15 slices shown]
[im 2/15]
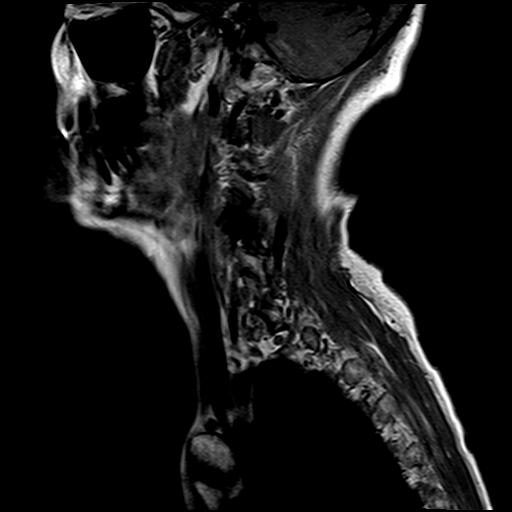
[im 8/15]
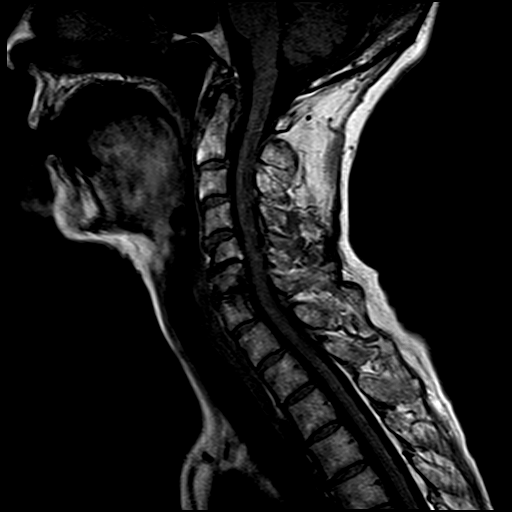
[im 13/15]
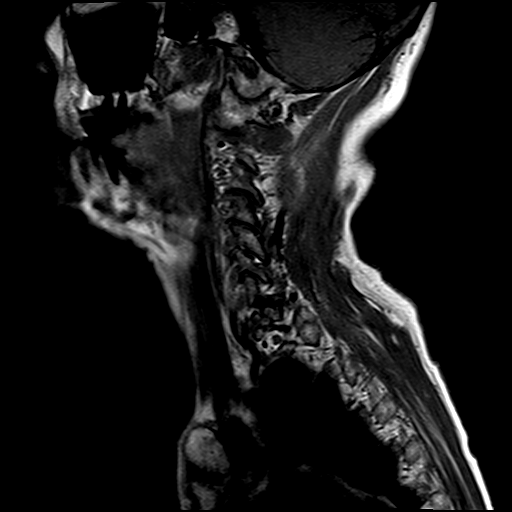

[Series 7: STIR · sagittal · 3.0mm · 0.47mm/px · 3 of 15 slices shown]
[im 2/15]
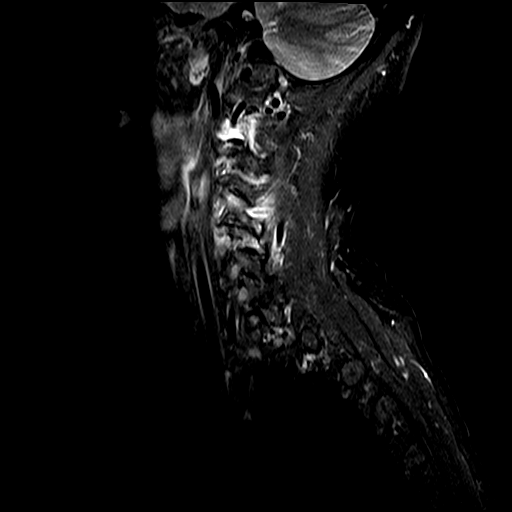
[im 8/15]
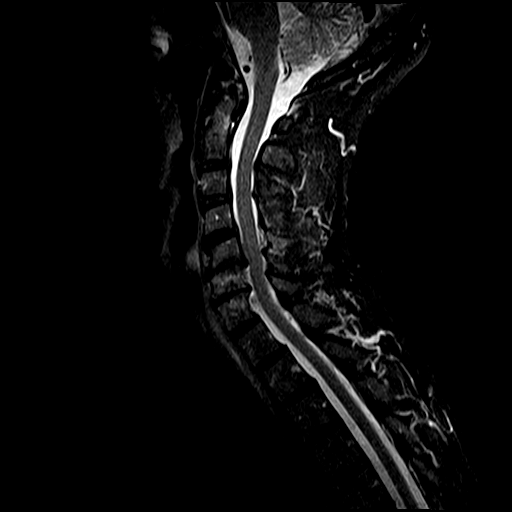
[im 13/15]
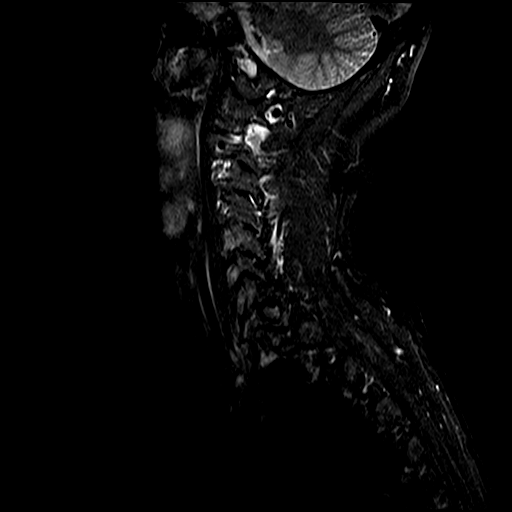

[Series 9: T2 · axial · 3.0mm · 0.39mm/px · z∈[-92,+22]mm · 9 of 18 slices shown (2 of 2)]
[im 1/18]
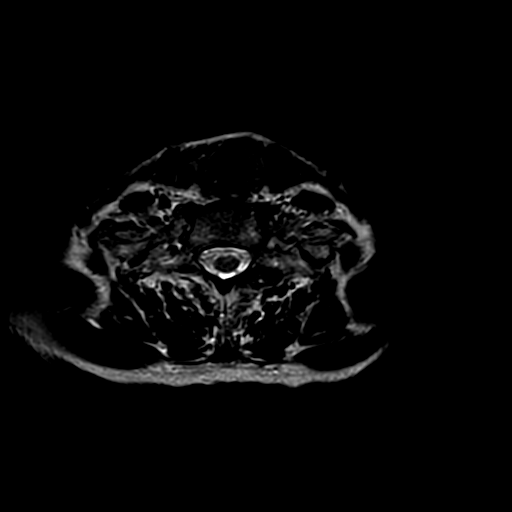
[im 2/18]
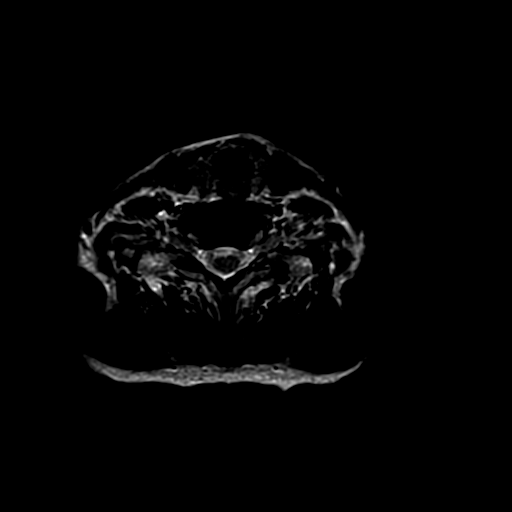
[im 4/18]
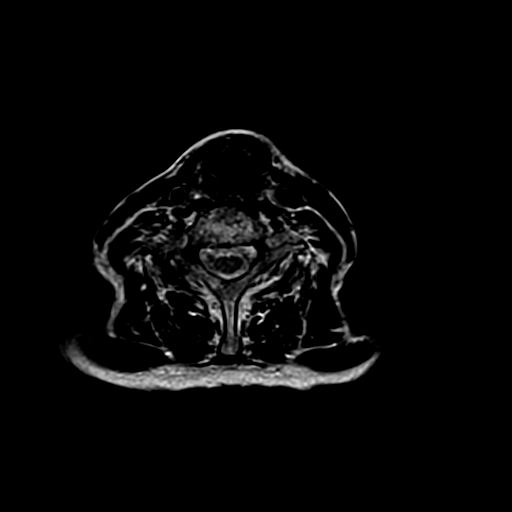
[im 6/18]
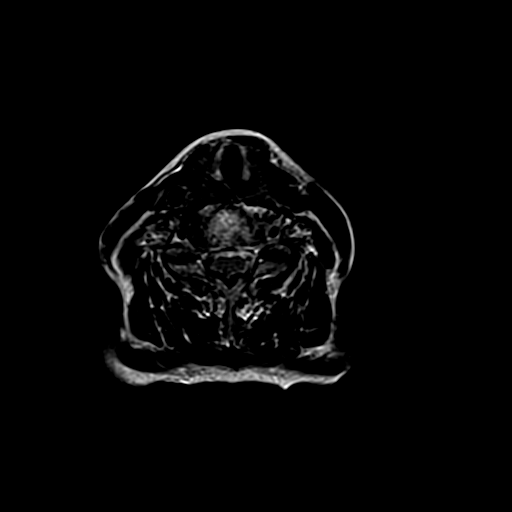
[im 7/18]
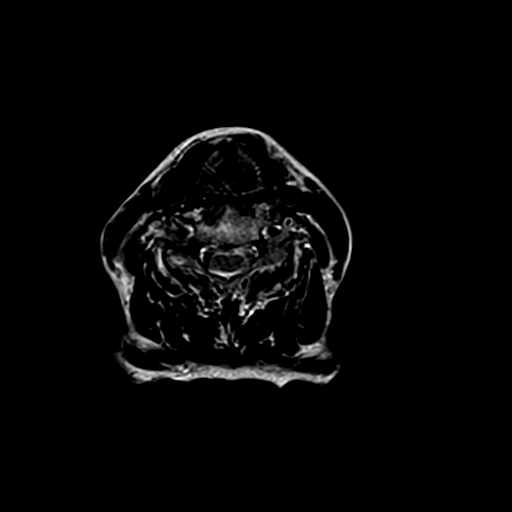
[im 9/18]
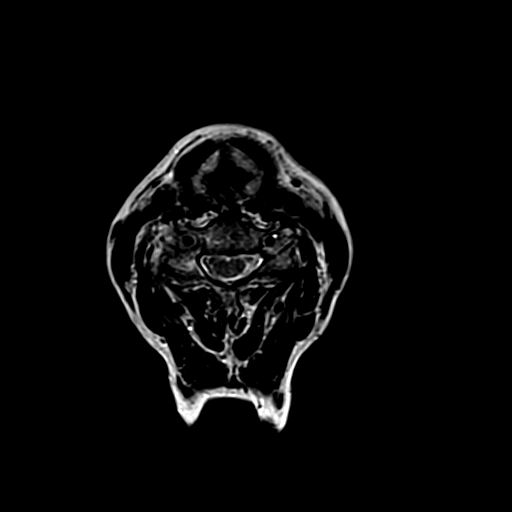
[im 11/18]
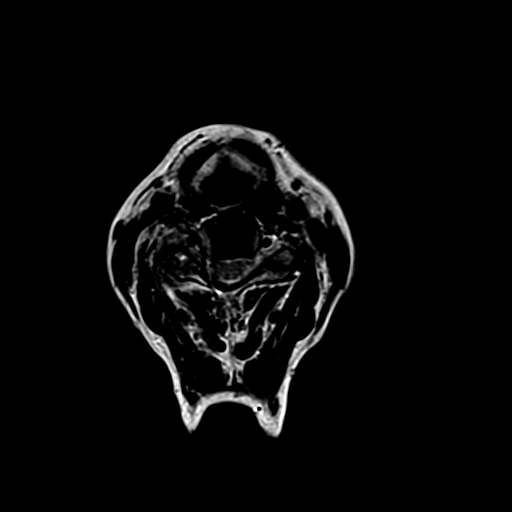
[im 12/18]
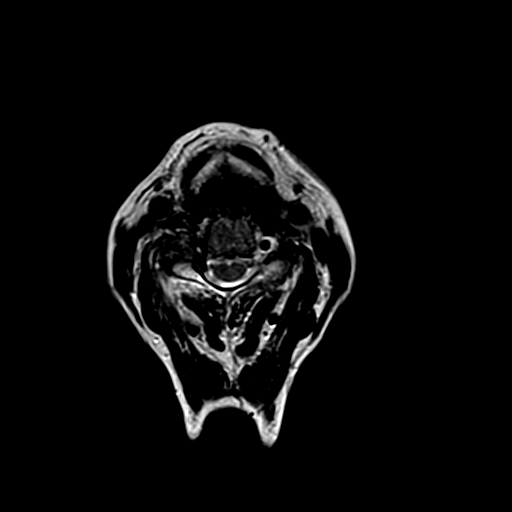
[im 16/18]
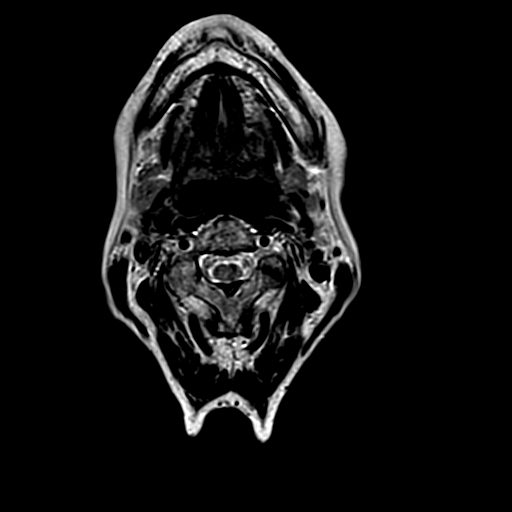

[23 of 48 positions shown; findings below may reference images not displayed]

FINDINGS: Vertebral bodies are normal in height, alignment and signal intensity. There is no acute fracture or subluxation.  Visualized spinal cord is normal in signal intensity.

C2-3 level is unremarkable. 

At C3-4 level, there is mild left neural foraminal stenosis from uncovertebral joint hypertrophy. 

At C4-5 level, there is a small broad-based central disc bulge mildly effacing the ventral CSF.  There is moderate right neural foraminal stenosis from uncovertebral joint hypertrophy. 

At C5-6 level, there is a small broad-based central disc osteophyte complex resulting in mild spinal stenosis.  There is severe bilateral neural foraminal stenosis from facet and uncovertebral joint hypertrophy. 

At C6-7 level, there is marked disc desiccation. There is a small broad-based central disc osteophyte complex with near complete effacement of the ventral CSF. There is severe bilateral neural foraminal stenosis from facet and uncovertebral joint hypertrophy. 

At C7-T1 level, there is mild bilateral neural foraminal stenosis from facet and uncovertebral joint hypertrophy. 

Paraspinal soft tissues are unremarkable.
IMPRESSION: 1. Mild spinal stenosis C5-6 level from a small central disc osteophyte complex. 

2. Multilevel neural foraminal stenosis as detailed above.

## 2022-01-14 ENCOUNTER — Telehealth (INDEPENDENT_AMBULATORY_CARE_PROVIDER_SITE_OTHER): Payer: Self-pay | Admitting: Family Medicine

## 2022-01-14 ENCOUNTER — Other Ambulatory Visit (INDEPENDENT_AMBULATORY_CARE_PROVIDER_SITE_OTHER): Payer: Self-pay | Admitting: Family Medicine

## 2022-01-14 DIAGNOSIS — M50122 Cervical disc disorder at C5-C6 level with radiculopathy: Secondary | ICD-10-CM

## 2022-01-14 DIAGNOSIS — M4802 Spinal stenosis, cervical region: Secondary | ICD-10-CM

## 2022-01-14 DIAGNOSIS — M542 Cervicalgia: Secondary | ICD-10-CM

## 2022-01-14 NOTE — Telephone Encounter (Signed)
Informed pt of Cervical MRI report - vpoced understanding - has PT scheduled but also requesting referral to Dr Allena [facility] in [facility] - referral sent

## 2022-01-16 ENCOUNTER — Ambulatory Visit (HOSPITAL_COMMUNITY)
Admission: RE | Admit: 2022-01-16 | Discharge: 2022-01-16 | Disposition: A | Discharge status: Still In Hospital | Payer: Commercial Managed Care - PPO | Source: Ambulatory Visit

## 2022-01-16 ENCOUNTER — Other Ambulatory Visit: Payer: Self-pay

## 2022-01-16 DIAGNOSIS — M50123 Cervical disc disorder at C6-C7 level with radiculopathy: Secondary | ICD-10-CM

## 2022-01-16 DIAGNOSIS — M50122 Cervical disc disorder at C5-C6 level with radiculopathy: Secondary | ICD-10-CM | POA: Insufficient documentation

## 2022-01-16 DIAGNOSIS — M542 Cervicalgia: Secondary | ICD-10-CM | POA: Insufficient documentation

## 2022-01-16 NOTE — PT Evaluation (Signed)
[facility] Medicine [facility]  Outpatient Physical Therapy  [address]  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  Relates she woke up with increased neck pain.  Relates she has difficulty turning the head while driving now which concerns her.  Patient relates right lateral neck pain and right shoulder pain.  She relates this pain did travel down to the elbow but this has improved.  Reports overall ROM restrictions in most directions, but especially turning to the right.  Denies specific headaches associated with this onset of neck pain    Pain location: Right lateral neck                    Pain description: SHARP, STABBING and SHOOTING    Pain frequency:  INTERMITTENT    Pain rating: Now 2   Best 2   Worst 9    Pain increases with: POSITION CHANGE, ADLs, ACTIVITY and LIFTING           decreases with : HEAT and REST    Weakness: Denies    Sleep affected: Initially, but now sleeping better    Previous episodes/treatments: None    Diagnostic tests: Reports having MRI, none on record.  States it showed severe narrowing    Medications for this problem: none    Next MD visit: TBD    Significant PMH:    Past Medical History:   Diagnosis Date   . Acquired hypothyroidism    . B-complex deficiency    . Body mass index (BMI) of 19.9 or less in adult    . Chronic neck pain    . Dextroscoliosis    . Essential hypertension    . Irritable bowel syndrome    . Low back pain    . Midline low back pain without sciatica    . Non-rheumatic tricuspid valve insufficiency    . Osteopenia    . Osteoporosis    . Palpitations    . Raynaud's syndrome     . Retrolisthesis of vertebrae    . Sensorineural hearing loss (SNHL) of right ear with unrestricted hearing of left ear    . SVT (supraventricular tachycardia) (CMS HCC)    . Urinary frequency             Occupation: Retired    Patient goals: REDUCE PAIN, NORMALIZE FUNCTION and return to yard work and church activities without pain    Subjective Functional Reports:    Sitting: WFL    Standing: WFL    Walking: [employer]    Lifting: Limited, lifted pans while cooking which significantly increased pain next day    Patient-Specific Functional Score:  Problem Score   1. Lifting pots/pans while cooking  1   2. Yard work 0   3. House ADLs 1   Total: 1      OBJECTIVE    Posture: FORWARD HEAD    Cervical AROM:   Right Left   Flexion 21 --------------------------   Extension 28 --------------------------   Rotation 20 29   Sidebending 9 10  Shoulder AROM: WFL    Strength (MMT /5): generally, 4-/5 through BUE    Special tests  Cervical flexion rotation test + bilaterally   Spurling's test -   Cervical distraction test + for decreased tension pain   Median nerve tension test -   Ulnar nerve tension test -     Reflexes  Biceps 2   Triceps 2   Brachioradialis 1     Sensation: WNL    Palpation: Suboccipitals, right scalenes, SCM insertion    Joint Mobility: Hypomobility throughout cervical spine in anterior direction    Treatment provided:REVIEW OF POC AND GOALS WITH PATIENT, ALL QUESTIONS ANSWERED, PATIENT EDUCATION and THERAPEUTIC EXERCISE      Access Code: [email or web address]  URL: [email or web address]  Date: 01/16/2022  Prepared by: Trinna Post Branch Pacitti    Exercises  - Supine Cervical Rotation AROM on Pillow  - 3 x daily - 7 x weekly - 1 sets - 15 reps - 2 hold  - Supine Cervical Retraction with Towel  - 3 x daily - 7 x weekly - 1 sets - 15 reps - 2 hold    ASSESSMENT  76 y.o. F presents to outpatient PT referred for cervical disc disorder at C6-C7 with right radiculopathy.  Patient demonstrates deficits in cervical ROM, posture, and  postural strength.  She will benefit from outpatient PT to address the above deficits to reach the highest functional level possible.    Patient tolerated evaluation and treatment well.  She demonstrated and verbalized understanding of HEP and patient education.     Rehab potential: FAIR    STGs (3 Weeks):  1.  Patient will demonstrate improved cervical AROM to at least 40 degrees rotation in each direction to aid in completion of ADLs.  2.  Patient will demonstrate independence with progressive HEP to maximize gains from PT.  3.  Patient will report max 5/10 pain to aid in completion of ADLs/work duties.    LTGs (6 Weeks):  1.  Patient will demonstrate improved B UE strength of at least 4+/5 to aid in completion of ADLs.  2.  Patient will demonstrate improved cervical AROM to at least 55 degrees rotation in each direction to aid in completion of ADLs.  3.  Patient will demonstrate 10 overhead presses with 3# and good form to aid in completion of ADLs and IADLs.  4.  Patient will report max 2/10 pain to aid in completion of ADLs/work duties.  5.  Patient will demonstrate improved functional ability in daily life via improved Patient Specific Functional score of at least 7.          PLAN  Patient will attend 2 times per week x 6 weeks. Therapy may include, but is not limited to THERAPEUTIC EXERCISES, MYOFASCIAL/JOINT MOBILIZATION, POSTURE/BODY MECHANICS, ERGONOMIC TRAINING, TRANSFER/GAIT TRAINING, HOME INSTRUCTIONS, HEAT/COLD, ULTRASOUND, ELECTRICAL STIMULATION, KINESIOTAPE, MECHANICAL TRACTION and NEURO RE-EDUCATIOIN    At next visit: Can initiate with MHP to the c-spine followed by MFR and therex for cervical suboccipital release, traction, gentle stretching and PROM, followed by active AROM, postural strengthening, and self cervical stretching.     Evaluation complexity:   Personal factors impacting POC: FREQUENT OR CHRONIC PAIN and PRE-EXISTING FUNCTIONAL LIMITATIONS   Co-morbidities impacting POC: HTN,  OA  Complexity of physical exam: INCLUDING MUSCULOSKELETAL SYSTEM (POSTURE, ROM, STRENGTH, HEIGHT/WEIGHT), INCLUDING NEUROMUSCULAR EXAM (BALANCE, GAIT, LOCOMOTION, MOBILITY) and INCLUDING ACTIVITY/MOBILITY RESTRICTIONS   Clinical Presentation: STABLE   Evaluation Complexity: LOW-HISTORY 0, EXAMINATION 1-2, STABLE PRESENTATION  Intervention minutes: EVALUATION 33 minutes and THERAPEUTIC EXERCISE 8 minutes    Emilliano Dilworth, PT  01/16/2022, 10:27    Start of Service: _________          Certification:    From:______  Through:_________    I certify the need for these services furnished under this plan of treatment and while under my care.    Referring Provider Signature: _______________     Date : _____________________

## 2022-01-18 ENCOUNTER — Other Ambulatory Visit: Payer: Self-pay

## 2022-01-18 ENCOUNTER — Ambulatory Visit (HOSPITAL_COMMUNITY)
Admission: RE | Admit: 2022-01-18 | Discharge: 2022-01-18 | Disposition: A | Discharge status: Still In Hospital | Payer: Commercial Managed Care - PPO | Source: Ambulatory Visit

## 2022-01-18 NOTE — PT Treatment (Signed)
[facility] Medicine [facility]  Outpatient Physical Therapy  [address]  [place], [address]  [phone]  (Fax) [phone]    Physical Therapy Treatment Note    Date: 01/18/2022  Patient's Name: Angela Benson  Date of Birth: 06/04/1946            Visit #2/POC:03/06/22  Authorization:[id number]      Evaluating Physical Therapist: Marcell Anger, PT, DPT  PT diagnosis/Reason for Referral: cervical disc disorder C5C6  Next Scheduled Physician Appointment: TBD          Subjective: Patient reports mild lateral right and left neck pain today.  States overall the neck is much better      Objective: MFR and therex as followed       EXERCISE/ACTIVITY NAME REPETITIONS RESISTANCE COMPLETED THIS DOS   MFR:   Suboccipital release   Cervical Traction   - Manual Y   Cervical PROM  Upper trap stretch  Levator scapula stretch  Scalenes stretch - Manual Y   Supine chin retraction x15 towel Y   SA foam roll up wall  Cervical retraction at door   x15  x12 with 2" hold - Y   T-band middle row  T-band row for floor  T-band low row  T-band horizontal row   x15  X15  x15  x15   Blue  Blue  [employer] Y   Cervical D2 flexion   x12 each side AROM Y                                 Assessment: Patient had > difficulty performing D2 flexion (right cervical flexion/right cervical rotation to left cervical extension and rotation) compared to the contralateral side.  Tolerated manual cervical traction well as well as manual cervical stretching without a reported increase in pain, only report of tightness > on the right compared to the left.  Progressed to postural strengthening with VCs provided for start position and sequencing.  Attempted joint mobilizations but patient had poor tolerance with side glides and PA mobs.    Plan: Continue POC and progressions as tolerated.    Total Session Time 54 and Timed code minutes 44, untimed 10 minutes for MHP to c-spine  THERAPEUTIC EXERCISE 24 minutes and JOINT MOBILIZATION/MFR 20  minutes      Angela Benson, PT  01/18/2022, 15:37

## 2022-01-22 ENCOUNTER — Ambulatory Visit (HOSPITAL_COMMUNITY)
Admission: RE | Admit: 2022-01-22 | Discharge: 2022-01-22 | Disposition: A | Discharge status: Still In Hospital | Payer: Commercial Managed Care - PPO | Source: Ambulatory Visit

## 2022-01-22 ENCOUNTER — Other Ambulatory Visit: Payer: Self-pay

## 2022-01-22 NOTE — PT Treatment (Signed)
[facility] Medicine [facility]  Outpatient Physical Therapy  [address]  [place], [address]  [phone]  (Fax) [phone]    Physical Therapy Treatment Note    Date: 01/22/2022  Patient's Name: Angela Benson  Date of Birth: 1946-04-21            Visit #  3  /POC: 03/06/22  Authorization: [id number]      Evaluating Physical Therapist: Marcell Anger PT, DPT  PT diagnosis/Reason for Referral: Cervical disc disorder C5-6  Next Scheduled Physician Appointment:  TBD          Subjective:  Pt states she did weedeating for about 2 hours yesterday so is very sore today.  Notes she does thing therapy is going to help.  She notes she is sore after sessions but overall feels like she is better.  Today she is very sore all over    Objective: Activities and manual therapies as noted below.  More manual therapies today d/t overall pain.  Ended session with HP for pain control    EXERCISE/ACTIVITY NAME REPETITIONS RESISTANCE COMPLETED THIS DOS   MFR:   Suboccipital release   Cervical Traction   - Manual Y   Cervical PROM  Upper trap stretch  Levator scapula stretch  Scalenes stretch - Manual Y   Supine chin retraction x15 towel Y   SA foam roll up wall  Cervical retraction at door   x15  x12 with 2" hold -  N   T-band middle row  T-band row for floor  T-band low row  T-band horizontal row   x15  X15  x15  x15   Blue  Blue  Red  Green  N   Cervical D2 flexion   x12 each side AROM  N                                     Assessment: Better after HP.  Pt remains very limited in ROM, Tender especially at occiput and with guarded movements.      Plan:  Will continue and progress as tolerated.  Hope to resume more active program next visit    STGs (3 Weeks):  1.  Patient will demonstrate improved cervical AROM to at least 40 degrees rotation in each direction to aid in completion of ADLs.  2.  Patient will demonstrate independence with progressive HEP to maximize gains from PT.  3.  Patient will  report max 5/10 pain to aid in completion of ADLs/work duties.    LTGs (6 Weeks):  1.  Patient will demonstrate improved B UE strength of at least 4+/5 to aid in completion of ADLs.  2.  Patient will demonstrate improved cervical AROM to at least 55 degrees rotation in each direction to aid in completion of ADLs.  3.  Patient will demonstrate 10 overhead presses with 3# and good form to aid in completion of ADLs and IADLs.  4.  Patient will report max 2/10 pain to aid in completion of ADLs/work duties.  5.  Patient will demonstrate improved functional ability in daily life via improved Patient Specific Functional score of at least 7.      Total Session Time 45 and Timed code minutes 30 Untimed minutes 15  JOINT MOBILIZATION/MFR 30 minutes      Klyn Kroening, PTA  01/22/2022, 15:40

## 2022-01-24 ENCOUNTER — Ambulatory Visit (HOSPITAL_COMMUNITY)
Admission: RE | Admit: 2022-01-24 | Discharge: 2022-01-24 | Disposition: A | Discharge status: Still In Hospital | Payer: Commercial Managed Care - PPO | Source: Ambulatory Visit

## 2022-01-24 ENCOUNTER — Other Ambulatory Visit: Payer: Self-pay

## 2022-01-24 NOTE — PT Treatment (Signed)
[facility] Medicine [facility]  Outpatient Physical Therapy  [address]  [place], [address]  [phone]  (Fax) [phone]    Physical Therapy Treatment Note    Date: 01/24/2022  Patient's Name: Angela Benson  Date of Birth: 10-25-1945            Visit #4/POC:03/06/22  Authorization:[id number]  POC Signed?: yes  POC Ends: 03/06/22  Next Progress Note Due: 8-10 sessions or 30 days      Evaluating Physical Therapist: Marcell Anger, PT, DPT  PT diagnosis/Reason for Referral: Cervical disc disorder C5-6  Next Scheduled Physician Appointment: TBD  Allergies/Contraindications: None          Subjective: Patient states she has been trying to live her normal life and did more weedeating and was sore again last night and this morning.  Relates 2-3/10 neck soreness today.    Objective: MFR and manual stretching as followed      EXERCISE/ACTIVITY NAME REPETITIONS RESISTANCE COMPLETED THIS DOS   MFR:  Suboccipital release  Cervical Traction   - Manual Y   Cervical PROM  Upper trap stretch  Levator scapula stretch  Scalenes stretch - Manual  Manual Y  Y   Supine chin retraction  Supine cervical retraction + rotation x15  x6 each way Towel  AROM Y   SA foam roll up wall  Cervical retraction at door   x15  x12 with 2" hold -  N   T-band middle row  T-band row for floor  T-band low row  T-band horizontal row   x15  X15  x15  x15   Blue  Blue  Red  Green  N   Cervical D2 flexion   x12 each side AROM  N                     Pre modulated electrical stimulation  MHP 15 minutes - Y         Assessment: Patient exhibits guarding today and increased tenseness during manual cervical PROM and stretching.  Tolerated MFR fairly well with report decreased pain during manual cervical traction holds.  Very TTP during MFR at the SCM muscle insertion.  Ended with modalities for pain reduction.    Plan: Continue to progress therex as tolerated    Total Session Time 44, Timed code minutes 29 and Untimed code  minutes 15  THERAPEUTIC EXERCISE 15 minutes, JOINT MOBILIZATION/MFR 14 minutes and ELECTRICAL STIMULATION 15 minutes      Caliph Borowiak, PT  01/24/2022, 16:15

## 2022-01-28 ENCOUNTER — Ambulatory Visit (HOSPITAL_COMMUNITY): Payer: Self-pay

## 2022-01-30 ENCOUNTER — Other Ambulatory Visit (INDEPENDENT_AMBULATORY_CARE_PROVIDER_SITE_OTHER): Payer: Self-pay | Admitting: Family Medicine

## 2022-01-30 DIAGNOSIS — R1310 Dysphagia, unspecified: Secondary | ICD-10-CM

## 2022-01-30 DIAGNOSIS — R0982 Postnasal drip: Secondary | ICD-10-CM

## 2022-02-05 ENCOUNTER — Other Ambulatory Visit: Payer: Self-pay

## 2022-02-05 ENCOUNTER — Ambulatory Visit (HOSPITAL_COMMUNITY)
Admission: RE | Admit: 2022-02-05 | Discharge: 2022-02-05 | Disposition: A | Discharge status: Still In Hospital | Payer: Commercial Managed Care - PPO | Source: Ambulatory Visit

## 2022-02-05 NOTE — PT Treatment (Signed)
[facility] Medicine [facility]  Outpatient Physical Therapy  [address]  [place], [address]  [phone]  (Fax) [phone]    Physical Therapy Treatment Note    Date: 02/05/2022  Patient's Name: Angela Benson  Date of Birth: [birth year]      Visit #5/POC:03/06/22  Authorization:[id number]  POC Signed?: yes  POC Ends: 03/06/22  Next Progress Note Due: 8-10 sessions or 30 days      Evaluating Physical Therapist: Marcell Anger, PT, DPT  PT diagnosis/Reason for Referral: Cervical disc disorder C5-6  Next Scheduled Physician Appointment: TBD  Allergies/Contraindications: None          Subjective: Patient reports she has continued to perform mulching and yard work activities over the past week and feels achy and sore all over.    Objective: Therex per flowsheet for cervical and scapular strengthening and mobility.      EXERCISE/ACTIVITY NAME REPETITIONS RESISTANCE COMPLETED THIS DOS   MFR:  Suboccipital release  Cervical Traction   - Manual N   Cervical PROM  Upper trap stretch  Levator scapula stretch  Scalenes stretch - Manual  Self  Self  SElf N  Y  Y  Y   Supine chin retraction  Supine cervical retraction + rotation x15  x6 each way Towel  AROM N   SA foam roll up wall  Cervical retraction at door  Scapular isometric at door   x15  x12 with 2" hold  x12 with 3" hold - Y   T-band middle row  T-band row for floor  T-band low row  T-band horizontal row   x15  X15  x15  x15   Blue  Blue  [employer] Y   Cervical D2 flexion   x12 each side AROM N   Seated cervical extension with towel   Seated cervical rotation with towel x15  x12 each side  AROM  AROM Y               Pre modulated electrical stimulation  MHP 15 minutes - Y         Assessment: Patient is performing exercises well with observable improvement with cervical rotation to near symmetry during newly added cervical rotation with towel.  Continues to exhibit improved cervical retraction ROM without compensatory  mechanics as well.    Plan: Continue to progress therex as tolerated      Total Session Time 45, Timed code minutes 35 and Untimed code minutes 10  THERAPEUTIC EXERCISE 35 minutes, MHP 10 minutes      Bridger Pizzi, PT  02/05/2022, 08:48

## 2022-02-07 ENCOUNTER — Other Ambulatory Visit: Payer: Self-pay

## 2022-02-07 ENCOUNTER — Ambulatory Visit (HOSPITAL_COMMUNITY)
Admission: RE | Admit: 2022-02-07 | Discharge: 2022-02-07 | Disposition: A | Discharge status: Still In Hospital | Payer: Commercial Managed Care - PPO | Source: Ambulatory Visit

## 2022-02-07 NOTE — PT Treatment (Signed)
[facility] Medicine [facility]  Outpatient Physical Therapy  [address]  [place], [address]  [phone]  (Fax) [phone]    Physical Therapy Treatment Note    Date: 02/07/2022  Patient's Name: Angela Benson  Date of Birth: 06-25-1946        Visit #6/POC:03/06/22  Authorization:12  POC Signed?:yes  POC Ends:03/06/22  Next Progress Note Due:8-10 sessions or 30 days      Evaluating Physical Therapist:Duncan Alejandro, PT, DPT  PT diagnosis/Reason for Referral:Cervical disc disorder C5-6  Next Scheduled Physician Appointment:TBD  Allergies/Contraindications:None          Subjective:Patient reports she feels her neck is improving - was able to mulch earlier this week and did not have to take a Tylenol afterwards.  States she does not have pain at rest currently, but does when moving her neck into right rotation and feels it on the right side.    Objective:Therex per flowsheet for cervical and scapular strengthening and mobility.      EXERCISE/ACTIVITY NAME REPETITIONS RESISTANCE COMPLETED THIS DOS   UBE 5 minutes - Y   MFR:  Suboccipital release  Cervical Traction   - Manual N   Cervical PROM  Upper trap stretch  Levator scapula stretch  Scalenes stretch - Manual  Self  Self  SElf N  Y  Y  Y   Supine chin retraction  Supine cervical retraction + rotation x15  x6 each way Towel  AROM N   SA foam roll up wall  Cervical retraction at door  Scapular isometric at door   x15  x12 with 2" hold  x12 with 3" hold - Y   T-band middle row  T-band row for floor  T-band low row  T-band horizontal row   x15  X15  x15  x15   Blue  Blue  Red  Green Y   Cervical D2 flexion   x12 each side AROM N   Seated cervical extension with towel   Seated cervical rotation with towel x15  x12 each side  AROM  AROM Y               Pre modulated electrical stimulation  MHP 15 minutes - Y     Access Code: [email or web address]  URL: [email or web address]  Date: 02/07/2022  Prepared by: Trinna Post  Beckhem Isadore    Exercises  - Mid-Lower Cervical Extension SNAG with Strap  - 2 x daily - 7 x weekly - 1 sets - 12 reps  - Scapular Retraction with Resistance  - 2 x daily - 7 x weekly - 1 sets - 15 reps  - Standing Shoulder Horizontal Abduction with Resistance  - 2 x daily - 7 x weekly - 1 sets - 12 reps  - Low Trap Setting at Wall  - 2 x daily - 7 x weekly - 1 sets - 10 reps    Assessment:Tolerated session well.  Requires VCs for sequencing of scapular retraction with t-band to ensure proper form.  Continues with right sided upper trap, scalenes, and levator scapula muscle flexibility deficits as evidenced through asymmetrical subjective pain during stretching of the right side compared to the left.  Updated HEP to progress towards cervical extension and scapular strengthening.    Plan:Continue to progress therex as tolerated      Total Session Time 45, Timed code minutes 30 and Untimed code minutes 15  THERAPEUTIC EXERCISE 30 minutes and ELECTRICAL STIMULATION 15 minutes      Tayllor Breitenstein,  PT  02/07/2022, 16:19

## 2022-02-12 ENCOUNTER — Ambulatory Visit (HOSPITAL_COMMUNITY): Payer: Self-pay

## 2022-02-14 ENCOUNTER — Ambulatory Visit
Admission: RE | Admit: 2022-02-14 | Discharge: 2022-02-14 | Disposition: A | Discharge status: Still In Hospital | Payer: Commercial Managed Care - PPO | Source: Ambulatory Visit | Attending: Family Medicine | Admitting: Family Medicine

## 2022-02-14 ENCOUNTER — Other Ambulatory Visit: Payer: Self-pay

## 2022-02-14 NOTE — PT Treatment (Signed)
[facility]  Outpatient Physical Therapy  [place], [address]  [phone]  [phone]    Physical Therapy Treatment Note    Date: 02/14/2022  Patient's Name: Angela Benson  Date of Birth: [birth year]      Visit #7/POC:03/06/22  Authorization:12  POC Signed?:yes  POC Ends:03/06/22  Next Progress Note Due:8-10 sessions or 30 days      Evaluating Physical Therapist:Macaulay Reicher, PT, DPT  PT diagnosis/Reason for Referral:Cervical disc disorder C5-6  Next Scheduled Physician Appointment:TBD  Allergies/Contraindications:None        DISCHARGE NOTE  Subjective:Patient reports she still has flare ups, but overall better and is sleeping better.  Patient reports she is independent with exercises as she can continue independently.  Worst pain at 6-7/10 and relates she didn't do anything specific.  Does relate to performing cutting grass, mulching, and other yard work over the past few weeks.    Objective:Therex for reassessment and d/c planning   41 flexion, 27 extension, 20 side bending each way with reproduction of pain with right side bending on the right side, 40 left rotation, and 50 right rotation  BUE shoulder AROM WFL  4+/5 BUE strength     Patient-Specific Functional Score:  Problem Score   1. Lifting pots/pans while cooking  10   2. Yard work 4   3. House ADLs 10   Total: 5 average        EXERCISE/ACTIVITY NAME REPETITIONS RESISTANCE COMPLETED THIS DOS   UBE 5 minutes - Y   MFR:  Suboccipital release  Cervical Traction   - Manual N   Cervical PROM  Upper trap stretch  Levator scapula stretch  Scalenes stretch - Manual  Self  Self  SElf N  N  N  N   Supine chin retraction  Supine cervical retraction + rotation x15  x6 each way Towel  AROM N   SA foam roll up wall  Cervical retraction at door  Scapular isometric at door   x15  x12 with 2" hold  x12 with 3" hold - N   T-band middle row  T-band row for floor  T-band low row  T-band  horizontal row   x15  X15  x15  x15   Blue  Blue  Red  Green N   Cervical D2 flexion   x12 each side AROM N   Seated cervical extension with towel  Seated cervical rotation with towel x15  x12 each side  AROM  AROM N         HEP comprehensive review  See list below Y   Pre modulated electrical stimulation  MHP 15 minutes - N     Access Code: [email or web address]  URL: [email or web address]  Date: 02/14/2022  Prepared by: Cristie Hem Valecia Beske    Exercises  - Mid-Lower Cervical Extension SNAG with Strap  - 1 x daily - 2-3 x weekly - 2 sets - 12 reps  - Seated Assisted Cervical Rotation with Towel  - 1 x daily - 2-3 x weekly - 2 sets - 12 reps  - Seated Thoracic Extension with Hands Behind Neck  - 1 x daily - 2-3 x weekly - 2 sets - 12 reps  - Cervical Retraction with Resistance  - 1 x daily - 2-3 x weekly - 2 sets - 10 reps - 2 hold  - Cervical AROM Flexion and Rotation  - 1 x daily - 2-3 x weekly - 2 sets -  10 reps  - Seated Cervical Sidebending Stretch  - 1 x daily - 2-3 x weekly - 1 sets - 2 reps - 30 hold    Assessment:Patient has met most goals except subjective pain goals and yard work pain goal.  She made good improvements with cervical AROM in all directions, except extension which remained the same.  Shoulder AROM and strength are now [facility].  She exhibits independent HEP performance at this time.  Based on reassessment, I am recommending discharge to comprehensive HEP at this time as listed above.  Interventions included therex, MFR, and modalities for the treatment of cervical disc disorder.    Plan:Discharge to HEP.    STGs (3 Weeks):  1.  Patient will demonstrate improved cervical AROM to at least 40 degrees rotation in each direction to aid in completion of ADLs. (met 02/14/22)  2.  Patient will demonstrate independence with progressive HEP to maximize gains from PT. (met 02/14/22)  3.  Patient will report max 5/10 pain to aid in completion of ADLs/work duties. (not met 02/14/22)    LTGs (6  Weeks):  1.  Patient will demonstrate improved B UE strength of at least 4+/5 to aid in completion of ADLs. (met 02/14/22)  2.  Patient will demonstrate improved cervical AROM to at least 55 degrees rotation in each direction to aid in completion of ADLs. (not met 02/14/22)  3.  Patient will demonstrate 10 overhead presses with 3# and good form to aid in completion of ADLs and IADLs. (met 02/14/22)  4.  Patient will report max 2/10 pain to aid in completion of ADLs/work duties. (not met 02/14/22)  5.  Patient will demonstrate improved functional ability in daily life via improved Patient Specific Functional score of at least 7. (not met 02/14/22)    Total Session Time 34 and Timed code minutes 34  THERAPEUTIC EXERCISE 34 minutes      Sevastian Witczak, PT  02/14/2022, 16:17      \

## 2022-03-14 ENCOUNTER — Ambulatory Visit (INDEPENDENT_AMBULATORY_CARE_PROVIDER_SITE_OTHER): Payer: Commercial Managed Care - PPO | Admitting: OTOLARYNGOLOGY

## 2022-03-14 ENCOUNTER — Other Ambulatory Visit: Payer: Self-pay

## 2022-03-14 ENCOUNTER — Encounter (INDEPENDENT_AMBULATORY_CARE_PROVIDER_SITE_OTHER): Payer: Self-pay | Admitting: OTOLARYNGOLOGY

## 2022-03-14 VITALS — Ht 66.0 in | Wt 103.0 lb

## 2022-03-14 DIAGNOSIS — R1310 Dysphagia, unspecified: Secondary | ICD-10-CM

## 2022-03-14 DIAGNOSIS — R053 Chronic cough: Secondary | ICD-10-CM

## 2022-03-14 DIAGNOSIS — K219 Gastro-esophageal reflux disease without esophagitis: Secondary | ICD-10-CM

## 2022-03-14 DIAGNOSIS — R0982 Postnasal drip: Secondary | ICD-10-CM

## 2022-03-14 MED ORDER — FAMOTIDINE 40 MG TABLET
40.0000 mg | ORAL_TABLET | Freq: Every evening | ORAL | 5 refills | Status: DC
Start: 2022-03-14 — End: 2022-04-19

## 2022-03-14 NOTE — Procedures (Signed)
ENT, [facility]  [address]  [place] [address]    Procedure Note    Name: Angela Benson MRN:  [id number]   Date: 03/14/2022 Age: 76 y.o.       31575 - LARYNGOSCOPY, FLEXIBLE DIAGNOSTIC (AMB ONLY)  Performed by: Conchita Paris, DO  Authorized by: Conchita Paris, DO     Time Out:     Immediately before the procedure, a time out was called:  Yes    Patient verified:  Yes    Procedure Verified:  Yes    Site Verified:  Yes  Documentation:      Indications for procedure: Chronic cough    Anesthesia: Oxymetazoline nasal spray    Description: The flexible endoscope was gently introduced into the nostril and passed along the floor of the nose to the nasopharynx. Adenoid was minimal and eustachian tubes normal. The retropalatal airway was patent.    The endoscope was passed to the oropharynx. Base of tongue displayed normal lingual tonsils, patent valelulla, and sharply defined upright epiglottis. Retrolingual airway was patent.    The larynx displayed normal true vocal cords with good mobility. False cords were normal. Arytenoid mucosa was pink with no edema.     The piriform recesses were symmetric without secretion. The hypopharynx was symmetric without lesion.    Findings: Laryngopharyngeal Reflux    The patient tolerated the procedure well.               Conchita Paris, DO

## 2022-03-14 NOTE — H&P (Signed)
[facility] Medicine  ENT, [facility]    Progress Note    Name: Angela Benson MRN:  [id number]   Date: 03/14/2022 Age: 76 y.o.          Follow Up      Subjective:   Chief Complaint:   Post Nasal Drip (Complains of PND and drainage in throat. Also states having a cough. Currently taking lisinopril. No hoarseness noted. States having some dysphagia at times.)       History of Present Illness:  Angela Benson is a 76 y.o. old female who presents to the clinic for follow-up.  Patient states that she having to clear her throat repeatedly.  She is having some dysphagia and some cough.  Patient has tried Flonase without relief.  Patient states she has not had any reflux but has not tried any medications.  She denies fever/chills, weight loss, hoarseness, neck masses or fever/chills/ night sweats.      Review of Systems     Physical Exam:     Vitals:    03/14/22 0900   Weight: 46.7 kg (103 lb)   Height: 1.676 m (5\' 6" )   BMI: 16.66      ENT Physical Exam  Constitutional  Appearance: patient appears well-developed, well-nourished and well-groomed,  Communication/Voice: communication appropriate for developmental age; vocal quality normal;  Head and Face  Appearance: head appears normal, face appears normal and face appears atraumatic;  Palpation: facial palpation normal;  Salivary: glands normal;  Ear  Hearing: intact;  Auricles: right auricle normal; left auricle normal;  External Mastoids: right external mastoid normal; left external mastoid normal;  Ear Canals: right ear canal normal; left ear canal normal;  Tympanic Membranes: right tympanic membrane normal; left tympanic membrane normal;  Nose  External Nose: nares patent bilaterally; external nose normal;  Internal Nose: nasal mucosa normal; septum normal; bilateral inferior turbinates normal;  Oral Cavity/Oropharynx  Lips: normal;  Teeth: normal;  Gums: gingiva normal;  Tongue: normal;  Oral mucosa: normal;  Hard palate: normal;  Neck  Neck: neck normal; neck  palpation normal;  Thyroid: thyroid normal;  Respiratory  Inspection: breathing unlabored; normal breathing rate;  Lymphatic  Palpation: lymph nodes normal;  Neurovestibular  Mental Status: alert and oriented;  Psychiatric: mood normal; affect is appropriate;  Cranial Nerves: cranial nerves intact;     Assessment and Plan:       ICD-10-CM    1. 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 Dysphagia, unspecified type  R13.10 31575 - LARYNGOSCOPY, FLEXIBLE DIAGNOSTIC (AMB ONLY)      3. Post-nasal drainage  R09.82       4. Chronic cough  R05.3         Orders Placed This Encounter    31575 - LARYNGOSCOPY, FLEXIBLE DIAGNOSTIC (AMB ONLY)    famotidine (PEPCID) 40 mg Oral Tablet   Will give a course of Pepcid to see if she has improvement.  If no improvement will consider lisinopril holiday.  No masses or lesions seen on NPL.      Follow up:  Return in about 4 weeks (around 04/11/2022).    04/13/2022, DO

## 2022-03-28 ENCOUNTER — Telehealth (INDEPENDENT_AMBULATORY_CARE_PROVIDER_SITE_OTHER): Payer: Self-pay | Admitting: OTOLARYNGOLOGY

## 2022-03-28 NOTE — Telephone Encounter (Signed)
Was prescribed Pepcid 40 mg and she is having side effects and is asking if we can switch to something else, requesting call. Has to run to see a patient at [facility] will be available again after 4 pm.

## 2022-03-29 MED ORDER — CIMETIDINE 400 MG TABLET
400.0000 mg | ORAL_TABLET | Freq: Every evening | ORAL | 3 refills | Status: DC
Start: 2022-03-29 — End: 2022-11-13

## 2022-04-15 ENCOUNTER — Encounter (INDEPENDENT_AMBULATORY_CARE_PROVIDER_SITE_OTHER): Payer: Self-pay | Admitting: OTOLARYNGOLOGY

## 2022-04-15 ENCOUNTER — Other Ambulatory Visit: Payer: Self-pay

## 2022-04-15 ENCOUNTER — Ambulatory Visit (INDEPENDENT_AMBULATORY_CARE_PROVIDER_SITE_OTHER): Payer: Commercial Managed Care - PPO | Admitting: OTOLARYNGOLOGY

## 2022-04-15 VITALS — Wt 105.0 lb

## 2022-04-15 DIAGNOSIS — R053 Chronic cough: Secondary | ICD-10-CM

## 2022-04-15 DIAGNOSIS — K219 Gastro-esophageal reflux disease without esophagitis: Secondary | ICD-10-CM

## 2022-04-15 DIAGNOSIS — J309 Allergic rhinitis, unspecified: Secondary | ICD-10-CM

## 2022-04-15 DIAGNOSIS — R1310 Dysphagia, unspecified: Secondary | ICD-10-CM

## 2022-04-15 DIAGNOSIS — R0982 Postnasal drip: Secondary | ICD-10-CM

## 2022-04-15 MED ORDER — AZELASTINE 137 MCG-FLUTICASONE 50 MCG/SPRAY NASAL SPRAY
1.0000 | Freq: Two times a day (BID) | NASAL | 5 refills | Status: DC
Start: 2022-04-15 — End: 2022-07-15

## 2022-04-15 NOTE — H&P (Signed)
[facility]  ENT, [place]    Progress Note    Name: Angela Benson MRN:  [id number]   Date: 04/15/2022 Age: 76 y.o.          Follow Up      Subjective:   Chief Complaint:   Throat Symptoms (Patient here for 1 month follow up on throat. )       History of Present Illness:  Angela Benson is a 76 y.o. old female who presents to the clinic for follow-up, Patient states that she has had some improvement. She is taking tagament and is off lisinopril. She is still having PND. She has tried Flonase in the past.     Review of Systems     Physical Exam:     Vitals:    04/15/22 1532   Weight: 47.6 kg (105 lb)      ENT Physical Exam  Constitutional  Appearance: patient appears well-developed, well-nourished and well-groomed,  Communication/Voice: communication appropriate for developmental age; vocal quality normal;  Head and Face  Appearance: head appears normal, face appears normal and face appears atraumatic;  Palpation: facial palpation normal;  Salivary: glands normal;  Ear  Hearing: intact;  Auricles: right auricle normal; left auricle normal;  External Mastoids: right external mastoid normal; left external mastoid normal;  Ear Canals: right ear canal normal; left ear canal normal;  Tympanic Membranes: right tympanic membrane normal; left tympanic membrane normal;  Nose  External Nose: nares patent bilaterally; external nose normal;  Internal Nose: nasal mucosa normal; septum normal; bilateral inferior turbinates normal;  Oral Cavity/Oropharynx  Lips: normal;  Teeth: normal;  Gums: gingiva normal;  Tongue: normal;  Oral mucosa: normal;  Hard palate: normal;  Neck  Neck: neck normal; neck palpation normal;  Thyroid: thyroid normal;  Respiratory  Inspection: breathing unlabored; normal breathing rate;  Lymphatic  Palpation: lymph nodes normal;  Neurovestibular  Mental Status: alert and oriented;  Psychiatric: mood normal; affect is appropriate;  Cranial Nerves: cranial nerves intact;     Assessment and Plan:        ICD-10-CM    1. Dysphagia, unspecified type  R13.10       2. 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 Chronic cough  R05.3       4. Post-nasal drainage  R09.82 31231 - NASAL ENDOSCOPY DIAGNOSTIC UNILATERAL OR BILATERAL (AMB ONLY)      5. Allergic rhinitis, unspecified seasonality, unspecified trigger  J30.9         Orders Placed This Encounter    31231 - NASAL ENDOSCOPY DIAGNOSTIC UNILATERAL OR BILATERAL (AMB ONLY)    azelastine-fluticasone (DYMISTA) 137-50 mcg/spray Nasal Spray, Non-Aerosol   Continue tagament  Will start dymista      Follow up:  Return in about 3 months (around 07/15/2022).    Dia Sitter, DO

## 2022-04-15 NOTE — Procedures (Signed)
[facility], [facility]  [place] [address]    Procedure Note    Name: Angela Benson MRN:  [id number]   Date: 04/15/2022 Age: 76 y.o.  DOB:   11/03/1945       31231 - NASAL ENDOSCOPY DIAGNOSTIC UNILATERAL OR BILATERAL (AMB ONLY)  Performed by: Dia Sitter, DO  Authorized by: Dia Sitter, DO     Time Out:     Immediately before the procedure, a time out was called:  Yes    Patient verified:  Yes    Procedure Verified:  Yes    Site Verified:  Yes  Documentation:      Indications for procedure: R/O foreign body or mass    Anesthesia: Oxymetazoline nasal spray    Description: Nasal endoscopy with rigid scope was performed with examination of the  septum, inferior, middle, and superior meatus, turbinates, sphenoethmoidal recess, and nasopharynx.     There were no polyps, pus, or granulation tissue noted.  ET orifices and nasopharynx were normal.     Findings: Allergic rhinitis    The patient tolerated the procedure well.             Dia Sitter, DO

## 2022-04-16 ENCOUNTER — Other Ambulatory Visit: Payer: Commercial Managed Care - PPO | Attending: Family Medicine | Admitting: Family Medicine

## 2022-04-16 ENCOUNTER — Ambulatory Visit (INDEPENDENT_AMBULATORY_CARE_PROVIDER_SITE_OTHER): Payer: Commercial Managed Care - PPO

## 2022-04-16 ENCOUNTER — Other Ambulatory Visit: Payer: Self-pay

## 2022-04-16 ENCOUNTER — Ambulatory Visit (INDEPENDENT_AMBULATORY_CARE_PROVIDER_SITE_OTHER): Payer: Self-pay | Admitting: Neurological Surgery

## 2022-04-16 DIAGNOSIS — E559 Vitamin D deficiency, unspecified: Secondary | ICD-10-CM | POA: Insufficient documentation

## 2022-04-16 DIAGNOSIS — G8929 Other chronic pain: Secondary | ICD-10-CM

## 2022-04-16 DIAGNOSIS — I10 Essential (primary) hypertension: Secondary | ICD-10-CM

## 2022-04-16 DIAGNOSIS — M545 Low back pain, unspecified: Secondary | ICD-10-CM | POA: Insufficient documentation

## 2022-04-16 DIAGNOSIS — E538 Deficiency of other specified B group vitamins: Secondary | ICD-10-CM

## 2022-04-16 LAB — CBC WITH DIFF
BASOPHIL #: 0 10*3/uL (ref 0.00–0.10)
BASOPHIL %: 1 % (ref 0–1)
EOSINOPHIL #: 0.3 10*3/uL (ref 0.00–0.50)
EOSINOPHIL %: 5 %
HCT: 42.5 % — ABNORMAL HIGH (ref 31.2–41.9)
HGB: 14.2 g/dL (ref 10.9–14.3)
LYMPHOCYTE #: 1.4 10*3/uL (ref 1.00–3.00)
LYMPHOCYTE %: 26 % (ref 16–44)
MCH: 31.7 pg (ref 24.7–32.8)
MCHC: 33.5 g/dL (ref 32.3–35.6)
MCV: 94.6 fL (ref 75.5–95.3)
MONOCYTE #: 0.7 10*3/uL (ref 0.30–1.00)
MONOCYTE %: 14 % — ABNORMAL HIGH (ref 5–13)
MPV: 8 fL (ref 7.9–10.8)
NEUTROPHIL #: 2.8 10*3/uL (ref 1.85–7.80)
NEUTROPHIL %: 54 % (ref 43–77)
PLATELETS: 261 10*3/uL (ref 140–440)
RBC: 4.49 10*6/uL (ref 3.63–4.92)
RDW: 13.2 % (ref 12.3–17.7)
WBC: 5.3 10*3/uL (ref 3.8–11.8)

## 2022-04-16 LAB — HEPATIC FUNCTION PANEL
ALBUMIN/GLOBULIN RATIO: 1.3 (ref 0.8–1.4)
ALBUMIN: 4.4 g/dL (ref 3.5–5.7)
ALKALINE PHOSPHATASE: 88 U/L (ref 34–104)
ALT (SGPT): 23 U/L (ref 7–52)
AST (SGOT): 33 U/L (ref 13–39)
BILIRUBIN DIRECT: 0.21 md/dL — ABNORMAL HIGH (ref <=0.20)
BILIRUBIN TOTAL: 1.2 mg/dL (ref 0.3–1.2)
BILIRUBIN, INDIRECT: 0.99 mg/dL (ref <=1)
GLOBULIN: 3.3 (ref 2.9–5.4)
PROTEIN TOTAL: 7.7 g/dL (ref 6.4–8.9)

## 2022-04-16 LAB — BASIC METABOLIC PANEL
ANION GAP: 6 mmol/L (ref 4–13)
BUN/CREA RATIO: 19 (ref 6–22)
BUN: 17 mg/dL (ref 7–25)
CALCIUM: 9.8 mg/dL (ref 8.6–10.3)
CHLORIDE: 103 mmol/L (ref 98–107)
CO2 TOTAL: 29 mmol/L (ref 21–31)
CREATININE: 0.91 mg/dL (ref 0.60–1.30)
ESTIMATED GFR: 66 mL/min/{1.73_m2} (ref >59)
GLUCOSE: 95 mg/dL (ref 74–109)
OSMOLALITY, CALCULATED: 277 mOsm/kg (ref 270–290)
POTASSIUM: 4.4 mmol/L (ref 3.5–5.1)
SODIUM: 138 mmol/L (ref 136–145)

## 2022-04-16 LAB — LIPID PANEL
CHOL/HDL RATIO: 2
CHOLESTEROL: 175 mg/dL (ref <200)
HDL CHOL: 88 mg/dL (ref 23–92)
LDL CALC: 70 mg/dL (ref 0–100)
TRIGLYCERIDES: 85 mg/dL (ref <=150)
VLDL CALC: 17 mg/dL (ref 0–50)

## 2022-04-16 LAB — URINALYSIS, MACROSCOPIC
BILIRUBIN: NEGATIVE mg/dL
BLOOD: NEGATIVE mg/dL
GLUCOSE: NEGATIVE mg/dL
KETONES: NEGATIVE mg/dL
LEUKOCYTES: NEGATIVE WBCs/uL
NITRITE: NEGATIVE
PH: 7.5 (ref 5.0–9.0)
PROTEIN: NEGATIVE mg/dL
SPECIFIC GRAVITY: 1.01 (ref 1.002–1.030)
UROBILINOGEN: NORMAL mg/dL

## 2022-04-16 LAB — VITAMIN D 25 TOTAL: VITAMIN D: 72 ng/mL (ref 30–100)

## 2022-04-16 LAB — VITAMIN B12: VITAMIN B 12: 458 pg/mL (ref 180–914)

## 2022-04-16 LAB — URINALYSIS, MICROSCOPIC: WBCS: 1 /hpf (ref <6)

## 2022-04-16 LAB — C-REACTIVE PROTEIN(CRP),INFLAMMATION: C-REACTIVE PROTEIN (CRP): 0.5 mg/dL (ref 0.1–0.5)

## 2022-04-16 LAB — THYROID STIMULATING HORMONE (SENSITIVE TSH): TSH: 2.229 u[IU]/mL (ref 0.450–5.330)

## 2022-04-19 ENCOUNTER — Other Ambulatory Visit: Payer: Self-pay

## 2022-04-19 ENCOUNTER — Encounter (INDEPENDENT_AMBULATORY_CARE_PROVIDER_SITE_OTHER): Payer: Self-pay | Admitting: Family Medicine

## 2022-04-19 ENCOUNTER — Ambulatory Visit (INDEPENDENT_AMBULATORY_CARE_PROVIDER_SITE_OTHER): Payer: Commercial Managed Care - PPO | Admitting: Family Medicine

## 2022-04-19 VITALS — BP 152/75 | HR 63 | Temp 98.0°F | Resp 20 | Ht 66.0 in | Wt 102.0 lb

## 2022-04-19 DIAGNOSIS — I10 Essential (primary) hypertension: Secondary | ICD-10-CM

## 2022-04-19 DIAGNOSIS — Z13820 Encounter for screening for osteoporosis: Secondary | ICD-10-CM

## 2022-04-19 DIAGNOSIS — E039 Hypothyroidism, unspecified: Secondary | ICD-10-CM

## 2022-04-19 DIAGNOSIS — Z1231 Encounter for screening mammogram for malignant neoplasm of breast: Secondary | ICD-10-CM

## 2022-04-19 DIAGNOSIS — M50123 Cervical disc disorder at C6-C7 level with radiculopathy: Secondary | ICD-10-CM

## 2022-04-19 MED ORDER — LEVOTHYROXINE 50 MCG TABLET
50.0000 ug | ORAL_TABLET | Freq: Every day | ORAL | 2 refills | Status: DC
Start: 2022-04-19 — End: 2023-06-12

## 2022-04-19 NOTE — Progress Notes (Signed)
FAMILY MEDICINE, [facility]  [address]  [place] [address]       Name: Angela Benson MRN:  [id number]   Date: 04/19/2022 Age: 76 y.o.          Provider: Mickey Farber, DO    Reason for visit: Follow Up 6 Months      History of Present Illness:  04/19/2022:  This 76 year old female returns for 6 month follow-up to review labs get refills all of her medications.  She states her blood pressure is 120/70 hol.  She limit taken after Cozaar 20 lisinopril was discontinued she saw Dr. Cliffton Asters cell for throat congestion nasal congestion she states she has had headache since last night he has says attack was not helped with the allergy symptoms and reflux she had an eye exam 2 days ago and advised eye care vision is good she has small cataracts patient is states that physical therapy has helped her neck.  Her hemoglobin was 14 hematocrit 42.5 platelets and white count were normal chemistry showed BUN 17 creatinine 0.91 glucose 95 cholesterol 175 HDL 88 LDL 70 triglycerides 85 TSH was 2.229 liver enzymes are normal except for a very slightly elevated conjugated bilirubin B12 was 458 and normal vitamin-D was 72 normal urinalysis normal  Historical Data    Past Medical History:  Past Medical History:   Diagnosis Date    Acquired hypothyroidism     B-complex deficiency     Body mass index (BMI) of 19.9 or less in adult     Chronic neck pain     Dextroscoliosis     Essential hypertension     Irritable bowel syndrome     Low back pain     Midline low back pain without sciatica     Non-rheumatic tricuspid valve insufficiency     Osteopenia     Osteoporosis     Palpitations     Raynaud's syndrome     Retrolisthesis of vertebrae     Sensorineural hearing loss (SNHL) of right ear with unrestricted hearing of left ear     SVT (supraventricular tachycardia) (CMS HCC)     Urinary frequency          Past Surgical History:  Past Surgical History:   Procedure Laterality Date    COLONOSCOPY      HX APPENDECTOMY       HX HYSTERECTOMY           Allergies:  Allergies   Allergen Reactions    Nitrofurantoin Hives/ Urticaria     Flu-like symptoms/hives    Penicillins Hives/ Urticaria     Hives/rash     Medications:  Current Outpatient Medications   Medication Sig    azelastine-fluticasone (DYMISTA) 137-50 mcg/spray Nasal Spray, Non-Aerosol Administer 1 Spray into affected nostril(s) Twice daily    Ca carb-Ca gluc-Mg ox-Mg gluco (CALCIUM MAGNESIUM) 500 mg calcium -250 mg Oral Tablet Take 1 Tablet by mouth Once a day    cholecalciferol, vitamin D3, 50 mcg (2,000 unit) Oral Tablet Take 1 Tablet (2,000 Units total) by mouth Once a day    cimetidine (TAGAMET) 400 mg Oral Tablet Take 1 Tablet (400 mg total) by mouth Every night    coenzyme Q10 100 mg Oral Capsule Take 1 Capsule (100 mg total) by mouth Once a day    cyanocobalamin (VITAMIN B 12) 1,000 mcg Oral Tablet Take 1 Tablet (1,000 mcg total) by mouth Once a day    levothyroxine (SYNTHROID) 50 mcg Oral  Tablet Take 1 Tablet (50 mcg total) by mouth Once a day    losartan (COZAAR) 25 mg Oral Tablet Take 1 Tablet (25 mg total) by mouth Once a day     Family History:  Family Medical History:       Problem Relation (Age of Onset)    Arthritis-osteo Father    Congestive Heart Failure Mother    Dementia Mother    Diabetes type II Father    Elevated Lipids Father    Heart Attack Father    Hypertension (High Blood Pressure) Mother, Father    Melanoma Father            Social History:  Social History     Socioeconomic History    Marital status: Married   Tobacco Use    Smoking status: Never     Passive exposure: Never    Smokeless tobacco: Never   Vaping Use    Vaping Use: Never used   Substance and Sexual Activity    Alcohol use: Never    Drug use: Never           Review of Systems:  Any pertinent Review of Systems as addressed in the HPI above.    Physical Exam:  Vital Signs:  Vitals:    04/19/22 0812   BP: (!) 152/75   Pulse: 63   Resp: 20   Temp: 36.7 C (98 F)   TempSrc: Temporal    SpO2: 97%   Weight: 46.3 kg (102 lb)   Height: 1.676 m (5\' 6" )   BMI: 16.5     Physical Exam  Constitutional:       General: She is awake.      Appearance: Normal appearance. She is underweight.   HENT:      Head: Normocephalic and atraumatic.      Right Ear: Tympanic membrane, ear canal and external ear normal.      Left Ear: Tympanic membrane, ear canal and external ear normal.      Nose: Nose normal.      Mouth/Throat:      Mouth: Mucous membranes are moist.      Pharynx: Oropharynx is clear.   Eyes:      Extraocular Movements: Extraocular movements intact.      Conjunctiva/sclera: Conjunctivae normal.      Pupils: Pupils are equal, round, and reactive to light.   Cardiovascular:      Rate and Rhythm: Normal rate and regular rhythm.      Heart sounds: S1 normal and S2 normal. Murmur heard.   Systolic murmur is present with a grade of 1/6.   Pulmonary:      Effort: Pulmonary effort is normal.      Breath sounds: Normal breath sounds.   Abdominal:      General: Abdomen is flat. Bowel sounds are normal.      Palpations: Abdomen is soft.   Musculoskeletal:         General: Normal range of motion.      Cervical back: Normal range of motion and neck supple.   Skin:     General: Skin is warm and dry.      Capillary Refill: Capillary refill takes less than 2 seconds.   Neurological:      General: No focal deficit present.      Mental Status: She is alert and oriented to person, place, and time. Mental status is at baseline.   Psychiatric:  Mood and Affect: Mood normal.         Behavior: Behavior normal.         Thought Content: Thought content normal.         Judgment: Judgment normal.     Assessment:    ICD-10-CM    1. Essential hypertension  I10 LIPID PANEL     HEPATIC FUNCTION PANEL     CBC/DIFF     URINALYSIS, MACROSCOPIC AND MICROSCOPIC W/CULTURE REFLEX     BASIC METABOLIC PANEL     THYROID STIMULATING HORMONE (SENSITIVE TSH)      2. Osteoporosis screening  Z13.820 BONE DENSITOMETRY COMPARISON      3.  Breast cancer screening by mammogram  Z12.31 MAMMO BILATERAL SCREENING      4. Acquired hypothyroidism  E03.9       5. 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  Ordered a bone density and a mammogram for 6 months from now ordered a lipid panel hepatic function panel CBC with diff urinalysis basic metabolic panel thyroid-stimulating hormone.  More than 50% of the visit was spent counseling coordinating care.  All questions were answered to his satisfaction of the patient.  I recommend the patient keep the elevation of the head of her with at least 30.  If the neck pain comes back she should go and see the pain management people in [place].  I think she was supposed to see Dr. Allena Katz in [place].  I told her to go ahead and start taking half of the 25 mg of Cozaar twice a day.  She will continue to monitor blood pressure closely.  Recommend limit salt in 1-2 g a day and liberalize calorie and protein intake.  Thyroid is well controlled and stable on current medications neck pain is well controlled and stable.  She will continue to follow-up with Dr. Lacretia Nicks in [place].    Return in about 6 months (around 10/18/2022).    Mickey Farber, DO     Portions of this note may be dictated using voice recognition software or a dictation service. Variances in spelling and vocabulary are possible and unintentional. Not all errors are caught/corrected. Please notify the There[facility] Parkin if any discrepancies are noted or if the meaning of any statement is not clear.

## 2022-04-19 NOTE — Nursing Note (Signed)
04/19/22 0623   Domestic Violence   Because we are aware of abuse and domestic violence today, we ask all patients: Are you being hurt, hit, or frightened by anyone at your home or in your life?  N   Basic Needs   Do you have any basic needs within your home that are not being met? (such as Food, Shelter, Games developer, Tranportation, paying for bills and/or medications) N   Advanced Directives   Do you have any advanced directives? Living Will & MPOA   Do you have the Advanced Directive(s) so we can scan them to your chart? Angela Benson

## 2022-04-24 ENCOUNTER — Other Ambulatory Visit (INDEPENDENT_AMBULATORY_CARE_PROVIDER_SITE_OTHER): Payer: Self-pay | Admitting: Family Medicine

## 2022-04-24 MED ORDER — LOSARTAN 25 MG TABLET
50.0000 mg | ORAL_TABLET | Freq: Every day | ORAL | 0 refills | Status: DC
Start: 2022-04-24 — End: 2022-05-06

## 2022-05-06 ENCOUNTER — Other Ambulatory Visit (INDEPENDENT_AMBULATORY_CARE_PROVIDER_SITE_OTHER): Payer: Self-pay | Admitting: Family Medicine

## 2022-05-06 MED ORDER — LISINOPRIL 20 MG TABLET
20.0000 mg | ORAL_TABLET | Freq: Every day | ORAL | 1 refills | Status: DC
Start: 2022-05-06 — End: 2022-11-27

## 2022-05-10 ENCOUNTER — Ambulatory Visit (HOSPITAL_COMMUNITY): Payer: Self-pay

## 2022-05-22 ENCOUNTER — Inpatient Hospital Stay (HOSPITAL_COMMUNITY)
Admission: RE | Admit: 2022-05-22 | Discharge: 2022-05-22 | Disposition: A | Discharge status: Home / Self Care | Payer: Commercial Managed Care - PPO | Source: Ambulatory Visit | Attending: Family Medicine | Admitting: Family Medicine

## 2022-05-22 ENCOUNTER — Inpatient Hospital Stay
Admission: RE | Admit: 2022-05-22 | Discharge: 2022-05-22 | Disposition: A | Discharge status: Home / Self Care | Payer: Commercial Managed Care - PPO | Source: Ambulatory Visit | Attending: Family Medicine | Admitting: Family Medicine

## 2022-05-22 ENCOUNTER — Other Ambulatory Visit: Payer: Self-pay

## 2022-05-22 ENCOUNTER — Encounter (HOSPITAL_COMMUNITY): Payer: Self-pay

## 2022-05-22 DIAGNOSIS — Z13820 Encounter for screening for osteoporosis: Secondary | ICD-10-CM

## 2022-05-22 DIAGNOSIS — Z780 Asymptomatic menopausal state: Secondary | ICD-10-CM | POA: Insufficient documentation

## 2022-05-22 DIAGNOSIS — E559 Vitamin D deficiency, unspecified: Secondary | ICD-10-CM | POA: Insufficient documentation

## 2022-05-22 DIAGNOSIS — Z1231 Encounter for screening mammogram for malignant neoplasm of breast: Secondary | ICD-10-CM | POA: Insufficient documentation

## 2022-05-22 DIAGNOSIS — M810 Age-related osteoporosis without current pathological fracture: Secondary | ICD-10-CM | POA: Insufficient documentation

## 2022-07-15 ENCOUNTER — Other Ambulatory Visit: Payer: Self-pay

## 2022-07-15 ENCOUNTER — Ambulatory Visit (INDEPENDENT_AMBULATORY_CARE_PROVIDER_SITE_OTHER): Payer: Medicare PPO | Admitting: OTOLARYNGOLOGY

## 2022-07-15 ENCOUNTER — Encounter (INDEPENDENT_AMBULATORY_CARE_PROVIDER_SITE_OTHER): Payer: Self-pay | Admitting: OTOLARYNGOLOGY

## 2022-07-15 VITALS — Ht 66.0 in | Wt 102.0 lb

## 2022-07-15 DIAGNOSIS — R1310 Dysphagia, unspecified: Secondary | ICD-10-CM

## 2022-07-15 DIAGNOSIS — R0982 Postnasal drip: Secondary | ICD-10-CM

## 2022-07-15 DIAGNOSIS — K219 Gastro-esophageal reflux disease without esophagitis: Secondary | ICD-10-CM

## 2022-07-15 DIAGNOSIS — J309 Allergic rhinitis, unspecified: Secondary | ICD-10-CM

## 2022-07-15 NOTE — H&P (Signed)
[facility] Medicine  ENT, [facility]    Progress Note    Name: Angela Benson MRN:  [id number]   Date: 07/15/2022 Age: 76 y.o.          Follow Up      Subjective:   Chief Complaint:   Follow Up 3 Months (3 month rc on throat. States continues to have a feeling of something in throat and having dysphagia as well.)       History of Present Illness:  Angela Benson is a 76 y.o. old female who presents to the clinic for follow-up. Patient states that she has had improvement in drainage with dymista.  Patient continues to have dysphagia and the feeling the it is more difficult to swallow. Patient states she can't take and stomach medications.     Review of Systems     Physical Exam:     Vitals:    07/15/22 1553   Weight: 46.3 kg (102 lb)   Height: 1.676 m (5\' 6" )   BMI: 16.5      ENT Physical Exam  Constitutional  Appearance: patient appears well-developed, well-nourished and well-groomed,  Communication/Voice: communication appropriate for developmental age; vocal quality normal;  Head and Face  Appearance: head appears normal, face appears normal and face appears atraumatic;  Palpation: facial palpation normal;  Salivary: glands normal;  Ear  Hearing: intact;  Auricles: right auricle normal; left auricle normal;  External Mastoids: right external mastoid normal; left external mastoid normal;  Ear Canals: right ear canal normal; left ear canal normal;  Tympanic Membranes: right tympanic membrane normal; left tympanic membrane normal;  Nose  External Nose: nares patent bilaterally; external nose normal;  Internal Nose: nasal mucosa normal; septum normal; bilateral inferior turbinates normal;  Oral Cavity/Oropharynx  Lips: normal;  Teeth: normal;  Gums: gingiva normal;  Tongue: normal;  Oral mucosa: normal;  Hard palate: normal;  Neck  Neck: neck normal; neck palpation normal;  Thyroid: thyroid normal;  Respiratory  Inspection: breathing unlabored; normal breathing rate;  Lymphatic  Palpation: lymph nodes  normal;  Neurovestibular  Mental Status: alert and oriented;  Psychiatric: mood normal; affect is appropriate;  Cranial Nerves: cranial nerves intact;       Assessment and Plan:       ICD-10-CM    1. Dysphagia, unspecified type  R13.10       2. 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 Post-nasal drainage  R09.82       4. Chronic allergic rhinitis  J30.9         Orders Placed This Encounter    - LARYNGOSCOPY, FLEXIBLE DIAGNOSTIC (AMB ONLY)   Will continue dymista  Recommend reflux gourmet  Will refer for EGD.       Follow up:  Return for Follow up after EGD.[id number], DO

## 2022-07-15 NOTE — Procedures (Signed)
ENT, [facility]  [address]  [place] [address]    Procedure Note    Name: Angela Benson MRN:  [id number]   Date: 07/15/2022 Age: 76 y.o.  DOB:   [birth year]       31575 - LARYNGOSCOPY, FLEXIBLE DIAGNOSTIC (AMB ONLY)    Performed by: Conchita Paris, DO  Authorized by: Conchita Paris, DO    Time Out:     Immediately before the procedure, a time out was called:  Yes    Patient verified:  Yes    Procedure Verified:  Yes    Site Verified:  Yes  Documentation:      Indications for procedure: Dysphagia    Anesthesia: Oxymetazoline nasal spray    Description: The flexible endoscope was gently introduced into the nostril and passed along the floor of the nose to the nasopharynx. Adenoid was minimal and eustachian tubes normal. The retropalatal airway was patent.    The endoscope was passed to the oropharynx. Base of tongue displayed normal lingual tonsils, patent valelulla, and sharply defined upright epiglottis. Retrolingual airway was patent.    The larynx displayed normal true vocal cords with good mobility. False cords were normal. Arytenoid mucosa was pink with no edema.     The piriform recesses were symmetric without secretion. The hypopharynx was symmetric without lesion.    Findings: Laryngopharyngeal Reflux    The patient tolerated the procedure well.                 Conchita Paris, DO

## 2022-08-13 ENCOUNTER — Ambulatory Visit (INDEPENDENT_AMBULATORY_CARE_PROVIDER_SITE_OTHER): Payer: Self-pay | Admitting: Surgery

## 2022-09-09 ENCOUNTER — Ambulatory Visit (HOSPITAL_COMMUNITY)
Admission: RE | Admit: 2022-09-09 | Discharge: 2022-09-09 | Disposition: A | Discharge status: Still In Hospital | Payer: Medicare PPO | Source: Ambulatory Visit

## 2022-09-09 DIAGNOSIS — M25551 Pain in right hip: Secondary | ICD-10-CM

## 2022-09-09 NOTE — PT Evaluation (Signed)
[facility]  Outpatient Physical Therapy  [place], [address]  [phone]  [phone]      Physical Therapy Lower Extremity Evaluation    Date: 09/09/2022  Patient's Name: Angela Benson  Date of Birth: August 15, 1945    PT diagnosis/Reason for Referral: right hip pain                   SUBJECTIVE  Date of onset: 2-3 years ago pain started. More recently her hip pain has been getting worse. She did see her MD recently and the discussion of a hip replacement may occur. Pt likes to remain active with gardening and working at [employer] of injury: chronic, pt has OA.     Previous episodes/treatments: has been seen a few years ago for her hip pain    Medications for this problem:  occasional OTC    Diagnostic tests: x-rays about 3 weeks ago. No report in Epic    Patient goals: REDUCE PAIN and NORMALIZE FUNCTION    Occupation:  works a Art therapist    Next MD visit: will set up later    Pain location: right hip, anterior and radiates down the front of thigh                    Pain description: DULL and ACHING    Pain frequency:  CONTINUOUS    Pain rating: Now 2   Best 2   Worst 8    Pain increases with: WALK and BENDING           decreases with : REST    Sensation: normal    Sleep affected: yes    Subjective Functional Reports:    Sitting: WFL    Standing: LIMITED    Walking: LIMITED    Lifting: LIMITED      Patient-Specific Functional Score:    Problem Score   1. walking 7   2. Bending/stooping 7   Total 7           OBJECTIVE    AROM   right left   Hip Flexion 105 110     ROM comments painful into flexion, abduction and internal rotaton    Strength     right left   Hip flexion  4- 4+   Hip extension  4- 4+   Hip abduction (supine) 4+ 4+   Hip adduction (sitting) 4+ 4+   Knee flexion  5 5   Knee extension  5 5     Strength comments: limited due to pain    Gait: NO ASSISTIVE DEVICE and antalgic gait    Hip Special Tests    Hip scouring left  negative  Hip scouring right positive    Treatment provided:REVIEW OF POC AND GOALS WITH PATIENT, ALL QUESTIONS ANSWERED, PATIENT EDUCATION, and THERAPEUTIC EXERCISE     Access Code: [email or web address]  URL: [email or web address]  Date: 09/09/2022  Prepared by: Ronie Spies    Exercises  - Hooklying Lumbar Rotation  - 3 x daily - 7 x weekly - 3 sets - 10 reps  - Side Stepping with Resistance at Ankles  - 3 x daily - 7 x weekly - 3 sets - 10 reps  - Standing Marching  - 2 x daily - 7 x weekly - 3 sets - 10 reps          ASSESSMENT    Impression: Pt presents to physical  therapy with left hip pain that has been ongoing  for years but has been getting worse most likely due to arthritis. Pt and MD have discussed hip replacement possibly in the future. Pt present with impairments of strength and ROM limiting her functional mobility and ability to complete work place activities. She also has pain with bending and squatting. Pt would benefit from skilled therapy to address impairments noted above in order to improve functional mobility and complete work place activities more comfortably.     Rehab potential: FAIR    Short Term Goals: 3 Weeks   -R. Hip PROM by 110 degrees of flexion   -Patient will be independent in HEP   -Pt will improve right hip flexion strength to at least 4/5.       Long Term Goals: 6 Weeks  -LE strength and ROM WFL to allow for normal body mechanics with mobility.  -LE strength WFL for symmetric LE WB during sit to stand without UE assist.  - Pt be able to modify movement to reach low book shelves and return to standing with reports of minimal pain.       PLAN  Patient will attend 2 times per week x 6 weeks. Therapy may include, but is not limited to THERAPEUTIC EXERCISES, MYOFASCIAL/JOINT MOBILIZATION, POSTURE/BODY MECHANICS, ERGONOMIC TRAINING, TRANSFER/GAIT TRAINING, HOME INSTRUCTIONS, HEAT/COLD, ULTRASOUND, ELECTRICAL STIMULATION, KINESIOTAPE, and NEURO RE-EDUCATIOIN    Plan for next visit: nustep  warmup, gentle hip distraction, hip and core strengthening       Evaluation complexity:   Personal factors impacting POC:  none   Co-morbidities impacting POC:  OA  Complexity of physical exam: INCLUDING MUSCULOSKELETAL SYSTEM (POSTURE, ROM, STRENGTH, HEIGHT/WEIGHT), INCLUDING NEUROMUSCULAR EXAM (BALANCE, GAIT, LOCOMOTION, MOBILITY), and INCLUDING ACTIVITY/MOBILITY RESTRICTIONS   Clinical Presentation: STABLE   Evaluation Complexity: LOW-HISTORY 0, EXAMINATION 1-2, STABLE PRESENTATION      Total Session Time 45, Timed code minutes 10, and Untimed code minutes 35         Intervention minutes: EVALUATION 35 minutes and THERAPEUTIC EXERCISE 10 minutes    Ronie Spies, PT  09/09/2022, 16:20          Start of Service: _________          Certification:    From:______  Through:_________    I certify the need for these services furnished under this plan of treatment and while under my care.    Referring Provider Signature: _______________     Date : _____________________    Printed name of Referring Provider________________________________________

## 2022-09-10 ENCOUNTER — Encounter (HOSPITAL_COMMUNITY): Payer: Self-pay

## 2022-09-11 ENCOUNTER — Other Ambulatory Visit: Payer: Self-pay

## 2022-09-11 ENCOUNTER — Ambulatory Visit (HOSPITAL_COMMUNITY)
Admission: RE | Admit: 2022-09-11 | Discharge: 2022-09-11 | Disposition: A | Discharge status: Still In Hospital | Payer: Medicare PPO | Source: Ambulatory Visit

## 2022-09-11 NOTE — PT Treatment (Signed)
[facility]  Outpatient Physical Therapy  [place], [address]  [phone]  [phone]    Physical Therapy Treatment Note    Date: 09/11/2022  Patient's Name: Angela Benson  Date of Birth: July 16, 1946            Visit #/POC: 2 / 12  Authorization:  POC Signed?:    POC Ends:  4/ 1 / 24  Next Progress Note Due:  10 visits or 10/08/22      Evaluating Physical Therapist: Ronie Spies DPT  PT diagnosis/Reason for Referral: right hip pain   Next Scheduled Physician Appointment:  after PT  Allergies/Contraindications:           Subjective:  States she is doing well today.  Notes she did go to [employer]  this morning and did well.  Is really considering total hip replacement.  Feels like she may be nearly ready.      Objective:  Warm up on Nustep followed by activities as noted below.     Measured ROM: not measured today        EXERCISE/ACTIVITY NAME REPETITIONS RESISTANCE COMPLETED THIS DOS   Nustep   6 min Level 4 yes   Standing hip flexor stretch   10 sec x 5  Within pain free ranges  yes   Side steps with resistance   4 trips in bars Green above knees Yes; HEP 09/09/22   Hip hike L/R    10 each Opposite foot on 4 in stool yes   FWB R with repeated side step L  FWB R with repeated forward step L   10 each  yes   LTR on ball  Bridge on ball   10  10  yes   Gluteal bridge   Ball squeeze  Isometric R hip abduction with isotonic L hip abduction   5 sec  x 10  5 sec x 10  10     green Yes   Yes   Yes                          Assessment: Pt tolerated treatment well and noted some discomfort with exercise but no increased pain at end of session.  She does have antalgic patterns during ambulation, transitions, and with exercise    Short Term Goals: 3 Weeks               -R. Hip PROM by 110 degrees of flexion               -Patient will be independent in HEP               -Pt will improve right hip flexion strength to at least 4/5.         Long Term Goals: 6  Weeks  -LE strength and ROM WFL to allow for normal body mechanics with mobility.  -LE strength WFL for symmetric LE WB during sit to stand without UE assist.  - Pt be able to modify movement to reach low book shelves and return to standing with reports of minimal pain.          Plan: Will monitor effects of treatment and proceed accordingly    Total Session Time 35 and Timed code minutes 35  THERAPEUTIC EXERCISE 35 minutes      Leelan Rajewski, PTA  09/11/2022, 10:12

## 2022-09-15 ENCOUNTER — Encounter (INDEPENDENT_AMBULATORY_CARE_PROVIDER_SITE_OTHER): Payer: Self-pay

## 2022-09-16 ENCOUNTER — Ambulatory Visit (HOSPITAL_COMMUNITY): Payer: Self-pay

## 2022-09-17 ENCOUNTER — Other Ambulatory Visit: Payer: Self-pay

## 2022-09-17 ENCOUNTER — Ambulatory Visit (INDEPENDENT_AMBULATORY_CARE_PROVIDER_SITE_OTHER): Payer: Medicare PPO | Admitting: Surgery

## 2022-09-17 ENCOUNTER — Encounter (INDEPENDENT_AMBULATORY_CARE_PROVIDER_SITE_OTHER): Payer: Self-pay | Admitting: Surgery

## 2022-09-17 ENCOUNTER — Ambulatory Visit (HOSPITAL_COMMUNITY)
Admission: RE | Admit: 2022-09-17 | Discharge: 2022-09-17 | Disposition: A | Discharge status: Still In Hospital | Payer: Medicare PPO | Source: Ambulatory Visit

## 2022-09-17 VITALS — BP 144/76 | HR 68 | Temp 96.9°F | Resp 18 | Ht 66.0 in | Wt 103.0 lb

## 2022-09-17 DIAGNOSIS — R1310 Dysphagia, unspecified: Secondary | ICD-10-CM

## 2022-09-17 DIAGNOSIS — K224 Dyskinesia of esophagus: Secondary | ICD-10-CM

## 2022-09-17 NOTE — PT Treatment (Signed)
[facility]  Outpatient Physical Therapy  [place], [address]  [phone]  [phone]    Physical Therapy Treatment Note    Date: 09/17/2022  Patient's Name: Angela Benson  Date of Birth: November 04, 1945        Visit #/POC: 3/ 12  Authorization:  POC Signed?:  no  POC Ends:  4/ 1 / 24  Next Progress Note Due:  10 visits or 10/08/22        Evaluating Physical Therapist: Ronie Spies DPT  PT diagnosis/Reason for Referral: right hip pain   Next Scheduled Physician Appointment:  after PT  Allergies/Contraindications:              Subjective:  States she is feeling much better but still having some pain.  States she had to get into the floor to help pack some boxes and noted when she got up she had pain.  Rates pain 5/10 at worst over past week.  Has noted more stiffness.      Objective: Warm up on Nustep followed by activities as noted below.      Measured ROM:  Not measured today      EXERCISE/ACTIVITY NAME REPETITIONS RESISTANCE COMPLETED THIS DOS   Nustep    6 min Level 4 yes   Standing hip flexor stretch    10 sec x 5  Within pain free ranges  yes   Side steps with resistance    4 trips in bars Green above knees Yes; HEP 09/09/22   Hip hike L/R     10 each Opposite foot on 4 in stool yes   FWB R with repeated side step L  FWB R with repeated forward step L    10 each   yes   LTR on ball  Bridge on ball    10  10   yes   Gluteal bridge   Ball squeeze  Isometric R hip abduction with isotonic L hip abduction    5 sec  x 10  5 sec x 10  10       green Yes   Yes   Yes                                       Assessment: Pt tolerated well.  She is responding to the treatment so far.  She does feel like she is doing better but has episodes of pain with some activities.  Pt is extremely active in community and likes walking outdoors.        Short Term Goals: 3 Weeks               -R. Hip PROM by 110 degrees of flexion               -Patient will be independent in  HEP               -Pt will improve right hip flexion strength to at least 4/5.         Long Term Goals: 6 Weeks  -LE strength and ROM WFL to allow for normal body mechanics with mobility.  -LE strength WFL for symmetric LE WB during sit to stand without UE assist.  - Pt be able to modify movement to reach low book shelves and return to standing with reports of minimal pain.  Plan: Will continue and progress as tolerated     Total Session Time 35 and Timed code minutes 35  THERAPEUTIC EXERCISE 35 minutes      Charon Akamine, PTA  09/17/2022, 13:14

## 2022-09-17 NOTE — Progress Notes (Signed)
GENERAL SURGERY, [facility]AL SURGERY  [address]  [place] [address]    History and Physical     Name: Angela SAINTHILAIRE MRN:  [id number]   Date: 09/17/2022 Age: 77 y.o.            Reason for Visit: EGD    History of Present Illness  Ms. Campus presents today for evaluation because of dysphagia and accumulation of secretions in the back of her throat for which she was originally saw Dr. Cyndia Diver who performed flexible nasopharyngolaryngoscopy.  She states that she has noticed the feeling having more difficulty swallowing.  She does have a significant history of connective tissue disorder (Raynaud's disease the patient denies any GERD symptoms such as heartburn reflux or chest discomfort.)    Review of the result(s) of each unique test:  Patient underwent diagnostic testing ( as mentioned above ) prior to this dates visit.  I have personally reviewed the results and that serves as a component of the medical decision making for this encounter       Review of prior external note(s) from each unique source:  Patients referral to this office including a recent assessment by the referring provider.  This was reviewed by me for this unique office visit for the indication and intent of the referral as well as any pertinent medical or surgical history relevant to the patients independent evaluation by me today.      Patient History  Past Medical History:   Diagnosis Date    Acquired hypothyroidism     B-complex deficiency     Body mass index (BMI) of 19.9 or less in adult     Chronic neck pain     Dextroscoliosis     Essential hypertension     Irritable bowel syndrome     Low back pain     Midline low back pain without sciatica     Non-rheumatic tricuspid valve insufficiency     Osteopenia     Osteoporosis     Palpitations     Raynaud's syndrome     Retrolisthesis of vertebrae     Sensorineural hearing loss (SNHL) of right ear with unrestricted hearing of left ear     SVT (supraventricular  tachycardia)     Urinary frequency          Past Surgical History:   Procedure Laterality Date    COLONOSCOPY      HX APPENDECTOMY      HX HYSTERECTOMY           Current Outpatient Medications   Medication Sig    azelastine (ASTELIN) 137 mcg (0.1 %) Nasal Aerosol, Spray Administer 1 Spray into each nostril Twice daily Use in each nostril as directed    Ca carb-Ca gluc-Mg ox-Mg gluco (CALCIUM MAGNESIUM) 500 mg calcium -250 mg Oral Tablet Take 1 Tablet by mouth Once a day    cholecalciferol, vitamin D3, 50 mcg (2,000 unit) Oral Tablet Take 1 Tablet (2,000 Units total) by mouth Once a day    cimetidine (TAGAMET) 400 mg Oral Tablet Take 1 Tablet (400 mg total) by mouth Every night    coenzyme Q10 100 mg Oral Capsule Take 1 Capsule (100 mg total) by mouth Once a day    cyanocobalamin (VITAMIN B 12) 1,000 mcg Oral Tablet Take 1 Tablet (1,000 mcg total) by mouth Once a day    fluticasone propionate (FLONASE) 50 mcg/actuation Nasal Spray, Suspension Administer 1 Spray into each nostril Once  a day    levothyroxine (SYNTHROID) 50 mcg Oral Tablet Take 1 Tablet (50 mcg total) by mouth Once a day    lisinopriL (PRINIVIL) 20 mg Oral Tablet Take 1 Tablet (20 mg total) by mouth Once a day     Allergies   Allergen Reactions    Nitrofurantoin Hives/ Urticaria     Flu-like symptoms/hives    Penicillins Hives/ Urticaria     Hives/rash     Family Medical History:       Problem Relation (Age of Onset)    Arthritis-osteo Father    Breast Cancer Maternal Aunt    Congestive Heart Failure Mother    Dementia Mother    Diabetes type II Father    Elevated Lipids Father    Heart Attack Father    Hypertension (High Blood Pressure) Mother, Father    Melanoma Father    No Known Problems Sister, Brother, Maternal Grandmother, Maternal Grandfather, Paternal Grandmother, Paternal Grandfather, Daughter, Son, Maternal Uncle, Paternal 66, Paternal Uncle, Other            Social History     Tobacco Use    Smoking status: Never     Passive exposure:  Never    Smokeless tobacco: Never   Vaping Use    Vaping status: Never Used   Substance Use Topics    Alcohol use: Never    Drug use: Never            Physical Examination:  Vitals:    09/17/22 1459   BP: (!) 144/76   Pulse: 68   Resp: 18   Temp: 36.1 C (96.9 F)   SpO2: 96%   Weight: 46.7 kg (103 lb)   Height: 1.676 m (5' 6"$ )   BMI: 16.66        General: appropriate for age. in no acute distress.    Vital signs are present above and have been reviewed by me     HEENT: Atraumatic, Normocephalic. PERRLA. EOMI. Nose clear. Throat clear    Lungs: Nonlabored breathing with symmetric expansion. Clear to auscultation bilaterally    Heart:Regular wth respect to rate and rythmn.    Abdomen:Soft. Nontender. Nondistended and benign    Extremities: Grossly normal. No major deformities     Neuro:  Grossly normal motor and sensory function    Psychiatric: Alert and oriented to person, place, and time. affect appropriate      Assessment and Plan  Because of the patient's history of Raynaud's, I suspect that her symptoms are coming not from acid reflux but from a motility disorder associated with Raynaud's.  I therefore we will schedule her for a barium swallow to get a better visualization of her swallowing mechanism and the contractility associated.  The patient stated that she has a feeling then her symptoms may indeed be associated with her connective tissue disorder.  The patient understood the plan of therapy and agreed to that plan.      Follow Up:  No follow-ups on file.      ICD-10-CM    1. 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 Dysphagia, unspecified type  R13.10           Leaha Cuervo B Irven Ingalsbe, MD ,MBA,FACS    I appreciate the opportunity to be involved in the care of your patients.  If you have any questions or concerns regarding this encounter, please do not hesitate to contact me at your convenience.  This note may have been partially generated using MModal Fluency Direct system,  and there may be some incorrect words, spellings, and punctuation that were not noted in checking the note before saving, though effort was made to avoid such errors.

## 2022-09-19 ENCOUNTER — Ambulatory Visit (HOSPITAL_COMMUNITY)
Admission: RE | Admit: 2022-09-19 | Discharge: 2022-09-19 | Disposition: A | Discharge status: Still In Hospital | Payer: Medicare PPO | Source: Ambulatory Visit

## 2022-09-19 NOTE — PT Evaluation (Addendum)
[facility]  Outpatient Physical Therapy  [place], [address]  [phone]  [phone]    Physical Therapy Treatment Note    Date: 09/19/2022           [facility]  Outpatient Physical Therapy  [place], [address]  [phone]  [phone]     Physical Therapy Treatment Note     Date: 09/17/2022  Patient's Name: Angela Benson  Date of Birth: 08/03/1945           Visit #/POC: 4/ 12  Authorization:  POC Signed?:  no  POC Ends:  4/ 1 / 24  Next Progress Note Due:  10 visits or 10/08/22        Evaluating Physical Therapist: Ronie Spies DPT  PT diagnosis/Reason for Referral: right hip pain   Next Scheduled Physician Appointment:  after PT  Allergies/Contraindications:        Subjective:  Pt states her pain is better today.      Objective: Warm up on Nustep followed by activities as noted below.       Measured ROM:  Not measured today.        EXERCISE/ACTIVITY NAME REPETITIONS RESISTANCE COMPLETED THIS DOS   Nustep    8 min Level 1 yes   Standing hip flexor stretch    10 sec x 5  Within pain free ranges  n   Side steps with resistance    4 trips in bars Green above knees n; HEP 09/09/22   Hip hike L/R     10 each Opposite foot on 4 in stool n   FWB R with repeated side step L  FWB R with repeated forward step L    10 each   n   LTR on ball  Bridge on ball    10  10   n   Gluteal bridge   Ball squeeze  Isometric R hip abduction with isotonic L hip abduction    5 sec  x 10  5 sec x 10  10       green n    supported sitting hip 90-90 rotation     x10    y (HEP on 09/19/22)    sitting adductor stretch      2x30 sec    y   [employer] walks  X2 length of hall way Red band: x1 around knees, x1 around ankles y   Lateral hip mobilization with belt   y   Getting on and off the floor and sitting on the floor   y               Assessment: Pt continues to have limited hip IR on the right. Initiated lateral  hip distraction/mobilization with belt. Pt also demonstrates difficulty sitting on the floor with legs criss-crossed. Pt occasionally needs to sit on the floor at work. Overall she feels her hip pain has improved since initial eval and has been able to walk further throughout her day.         Short Term Goals: 3 Weeks               -R. Hip PROM by 110 degrees of flexion               -Patient will be independent in HEP               -Pt  will improve right hip flexion strength to at least 4/5.         Long Term Goals: 6 Weeks  -LE strength and ROM WFL to allow for normal body mechanics with mobility.  -LE strength WFL for symmetric LE WB during sit to stand without UE assist.  - Pt be able to modify movement to reach low book shelves and return to standing with reports of minimal pain.                  Plan: Will continue and progress as tolerated       Total Session Time 45 and Timed code minutes 45  THERAPEUTIC EXERCISE 20 minutes, JOINT MOBILIZATION/MFR 10 minutes, and THERAPEUTIC ACTIVITY 15 minutes      Ronie Spies, PT  09/19/2022, 16:29

## 2022-09-23 ENCOUNTER — Ambulatory Visit (HOSPITAL_COMMUNITY): Payer: Self-pay

## 2022-09-24 ENCOUNTER — Ambulatory Visit (HOSPITAL_COMMUNITY): Payer: Self-pay

## 2022-09-24 ENCOUNTER — Ambulatory Visit (HOSPITAL_COMMUNITY)
Admission: RE | Admit: 2022-09-24 | Discharge: 2022-09-24 | Disposition: A | Discharge status: Still In Hospital | Payer: Medicare PPO | Source: Ambulatory Visit

## 2022-09-24 ENCOUNTER — Other Ambulatory Visit (HOSPITAL_COMMUNITY): Payer: Self-pay | Admitting: Orthopaedic Surgery

## 2022-09-24 DIAGNOSIS — M25551 Pain in right hip: Secondary | ICD-10-CM

## 2022-09-24 NOTE — PT Treatment (Signed)
[facility]  Outpatient Physical Therapy  [place], [address]  [phone]  [phone]     Physical Therapy Treatment Note     Date: 09/19/2022              [facility]  Outpatient Physical Therapy  [place], [address]  [phone]  [phone]     Physical Therapy Treatment Note     Date: 09/17/2022  Patient's Name: Angela Benson  Date of Birth: 1946/07/10        Visit #/POC: 5/ 12  Authorization:  POC Signed?:  no  POC Ends:  4/ 1 / 24  Next Progress Note Due:  10 visits or 10/08/22     Evaluating Physical Therapist: Ronie Spies DPT  PT diagnosis/Reason for Referral: right hip pain   Next Scheduled Physician Appointment:  after PT  Allergies/Contraindications:         Subjective:  Pt reports increased hip pain today and is unsure why. She was helping a friend move over the weekend and thinks that may have contributed.      Objective: Warm up on Nustep followed by activities as noted below.       Measured ROM:  Not measured today.        EXERCISE/ACTIVITY NAME REPETITIONS RESISTANCE COMPLETED THIS DOS   Nustep    10 min Level 3 yes   Standing hip flexor stretch    10 sec x 5  Within pain free ranges  y   Side steps with resistance    4 trips in bars Green above knees n; HEP 09/09/22   Hip hike L/R     10 each Opposite foot on 4 in stool n   FWB R with repeated side step L  FWB R with repeated forward step L    10 each   n   LTR on ball  Bridge on ball    10  10   n   Gluteal bridge   Ball squeeze  Isometric R hip abduction with isotonic L hip abduction    5 sec  x 10  5 sec x 10  10       green n    supported sitting hip 90-90 rotation     x10    n (HEP on 09/19/22)    sitting adductor stretch      2x30 sec    n   Monster walks  X2 length of hall way Red band: x1 around knees, x1 around ankles n   Lateral hip mobilization with belt  Long axis hip distraction grade 2      N      y    Getting on and off the floor and sitting on the floor     n   DKTC with feet on swiss ball  x20  y   Lumbar rotation with legs on swiss ball x20  y   Mini squat x15  y         Assessment: Initiated long axis distraction for hip pain today. Therapy session focused more on low level mobility due to pain today.         Short Term Goals: 3 Weeks               -R. Hip PROM by 110 degrees of flexion               -  Patient will be independent in HEP               -Pt will improve right hip flexion strength to at least 4/5.         Long Term Goals: 6 Weeks  -LE strength and ROM WFL to allow for normal body mechanics with mobility.  -LE strength WFL for symmetric LE WB during sit to stand without UE assist.  - Pt be able to modify movement to reach low book shelves and return to standing with reports of minimal pain.           Plan: Will continue and progress as tolerated     Total Session Time 40 and Timed code minutes 40  THERAPEUTIC EXERCISE 30 minutes and JOINT MOBILIZATION/MFR 10 minutes      Ronie Spies, PT  09/24/2022, 15:35

## 2022-09-25 ENCOUNTER — Inpatient Hospital Stay
Admission: RE | Admit: 2022-09-25 | Discharge: 2022-09-25 | Disposition: A | Discharge status: Home / Self Care | Payer: Medicare PPO | Source: Ambulatory Visit | Attending: Surgery | Admitting: Surgery

## 2022-09-25 ENCOUNTER — Other Ambulatory Visit: Payer: Self-pay

## 2022-09-25 DIAGNOSIS — K224 Dyskinesia of esophagus: Secondary | ICD-10-CM | POA: Insufficient documentation

## 2022-09-25 MED ORDER — BARIUM SULFATE 700 MG TABLET
1.0000 | ORAL_TABLET | ORAL | Status: AC
Start: 2022-09-25 — End: 2022-09-25
  Administered 2022-09-25: 1 via ORAL

## 2022-09-25 MED ORDER — BARIUM SULFATE 105 % (W/V), 58 % (W/W) ORAL SUSPENSION
300.0000 mL | ORAL | Status: AC
Start: 2022-09-25 — End: 2022-09-25
  Administered 2022-09-25: 300 mL via RECTAL

## 2022-09-25 MED ORDER — SOD BICARB-CITRIC AC-SIMETH 2.21 GRAM-1.53 GRAM/4 GRAM GRANULES EFFERV
1.0000 | GRANULES | ORAL | Status: AC
Start: 2022-09-25 — End: 2022-09-25
  Administered 2022-09-25: 1 via ORAL

## 2022-09-25 MED ORDER — BARIUM SULFATE 96 % (W/W) ORAL POWDER FOR SUSPENSION
176.0000 g | INHALATION_SUSPENSION | ORAL | Status: AC
Start: 2022-09-25 — End: 2022-09-25
  Administered 2022-09-25: 176 g via ORAL

## 2022-09-26 ENCOUNTER — Ambulatory Visit (HOSPITAL_COMMUNITY)
Admission: RE | Admit: 2022-09-26 | Discharge: 2022-09-26 | Disposition: A | Discharge status: Still In Hospital | Payer: Medicare PPO | Source: Ambulatory Visit

## 2022-09-26 DIAGNOSIS — K224 Dyskinesia of esophagus: Secondary | ICD-10-CM

## 2022-09-26 NOTE — PT Treatment (Signed)
[facility]  Outpatient Physical Therapy  [place], [address]  [phone]  [phone]     Physical Therapy Treatment Note     Date: 09/26/2022  Patient's Name: Angela Benson  Date of Birth: 11-17-1945        Visit #/POC: 5/ 12  Authorization:  POC Signed?:  no  POC Ends:  4/ 1 / 24  Next Progress Note Due:  10 visits or 10/08/22     Evaluating Physical Therapist: Ronie Spies DPT  PT diagnosis/Reason for Referral: right hip pain   Next Scheduled Physician Appointment:  after PT  Allergies/Contraindications:      Subjective:  Pt reports increased hip pain today and is unsure why. She was helping a friend move over the weekend and thinks that may have contributed.      Objective: Warm up on Nustep followed by activities as noted below.       Measured ROM:  Not measured today.        EXERCISE/ACTIVITY NAME REPETITIONS RESISTANCE COMPLETED THIS DOS   Nustep    10 min Level 1 y   Standing hip flexor stretch    20 sec x 3 Within pain free ranges  y   Side steps with resistance    4 trips in bars Green above knees n; HEP 09/09/22   Hip hike L/R     10 each Opposite foot on 4 in stool n   FWB R with repeated side step L  FWB R with repeated forward step L    10 each   n   LTR on ball  Bridge on ball    10  10   n   Gluteal bridge     bridge with legs on swiss ball     Glute bridge with band    X12    X12        2x15               Green bridge  Y    Y        y    supported sitting hip 90-90 rotation     x10    n (HEP on 09/19/22)    sitting adductor stretch      2x30 sec    n   Monster walks  X2 length of hall way Red band: x1 around knees, x1 around ankles n   Lateral hip mobilization with belt  Long axis hip distraction grade 2      N        y   Getting on and off the floor and sitting on the floor     n   DKTC with feet on swiss ball  x20   n   Lumbar rotation with legs on swiss ball x20   n   Mini squat x15   n   Supine march with and without band  around feet 2x10 Yellow band y         Assessment: Pt continues to report hip pain that fluctuates. She does report some discomfort with resisted hip flexion. Pt will not be seen next week do to schedule conflicts.      Short Term Goals: 3 Weeks               -R. Hip PROM by 110 degrees of flexion               -Patient  will be independent in HEP               -Pt will improve right hip flexion strength to at least 4/5.         Long Term Goals: 6 Weeks  -LE strength and ROM WFL to allow for normal body mechanics with mobility.  -LE strength WFL for symmetric LE WB during sit to stand without UE assist.  - Pt be able to modify movement to reach low book shelves and return to standing with reports of minimal pain.         Plan: Will continue and progress as tolerated     Total Session Time 40  THERAPEUTIC EXERCISE 30 minutes and JOINT MOBILIZATION/MFR 10 minutes      Ronie Spies, PT  09/26/2022, 15:34

## 2022-10-02 ENCOUNTER — Ambulatory Visit (HOSPITAL_COMMUNITY): Payer: Self-pay

## 2022-10-07 ENCOUNTER — Other Ambulatory Visit: Payer: Self-pay

## 2022-10-07 ENCOUNTER — Ambulatory Visit
Admission: RE | Admit: 2022-10-07 | Discharge: 2022-10-07 | Disposition: A | Discharge status: Still In Hospital | Payer: Medicare PPO | Source: Ambulatory Visit | Attending: Orthopaedic Surgery | Admitting: Orthopaedic Surgery

## 2022-10-07 NOTE — PT Treatment (Signed)
[facility]  Outpatient Physical Therapy  [place], [address]  [phone]  [phone]    Physical Therapy Treatment Note    Date: 10/07/2022  Patient's Name: Angela Benson  Date of Birth: [birth year]  Physical Therapy Visit      Visit #/POC: 6/ 12  Authorization: 12  POC Signed?:  yes  POC Ends:  4/ 1 / 24  Next Progress Note Due:  10 visits or 10/08/22     Evaluating Physical Therapist: Ronie Spies DPT  PT diagnosis/Reason for Referral: right hip pain   Next Scheduled Physician Appointment:  after PT  Allergies/Contraindications:                  Subjective: States she continues to have pain with activities.  Work is still tough.  She continues to have difficulty bending over.  States she is doing exercise at home.  Reports she was ill last week so still not back to normal today    Objective: Warm up on Nustep followed by activities as noted below      EXERCISE/ACTIVITY NAME REPETITIONS RESISTANCE COMPLETED THIS DOS   Nustep    10 min Level 4 y   Standing hip flexor stretch    20 sec x 3 Within pain free ranges  y   Side steps with resistance    4 trips in bars Green above knees n; HEP 09/09/22   Hip hike L/R     10 each Opposite foot on 4 in stool n   FWB R with repeated side step L  FWB R with repeated forward step L    10 each   n   LTR on ball  Bridge on ball    10  10   n   Gluteal bridge      bridge with legs on swiss ball      Glute bridge with band    X12     X12           2x15                   Green bridge  Y     Y           y    supported sitting hip 90-90 rotation     x10    n (HEP on 09/19/22)    sitting adductor stretch      2x30 sec    n   Monster walks  X2 length of hall way Red band: x1 around knees, x1 around ankles n   Lateral hip mobilization with belt  Long axis hip distraction grade 2      N        n   Getting on and off the floor and sitting on the floor     n   DKTC with feet on swiss ball  x20   n   Lumbar rotation with  legs on swiss ball x20   n   Mini squat x15   y   Supine march with and without band around feet 2x10 green y             Assessment: Pt tolerated treatment well.  She does continue to have discomfort especially with stooping, bending, squatting.  She does well with walking and with exercise.      Short Term Goals: 3 Weeks               -  R. Hip PROM by 110 degrees of flexion               -Patient will be independent in HEP               -Pt will improve right hip flexion strength to at least 4/5.         Long Term Goals: 6 Weeks  -LE strength and ROM WFL to allow for normal body mechanics with mobility.  -LE strength WFL for symmetric LE WB during sit to stand without UE assist.  - Pt be able to modify movement to reach low book shelves and return to standing with reports of minimal pain.          Plan: PT to reassess next visit    Total Session Time 35 and Timed code minutes 35  THERAPEUTIC EXERCISE 35 minutes      Lawrnce Reyez, PTA  10/07/2022, 15:32

## 2022-10-08 ENCOUNTER — Encounter (INDEPENDENT_AMBULATORY_CARE_PROVIDER_SITE_OTHER): Payer: Self-pay | Admitting: Surgery

## 2022-10-08 ENCOUNTER — Ambulatory Visit (INDEPENDENT_AMBULATORY_CARE_PROVIDER_SITE_OTHER): Payer: Medicare PPO | Admitting: Surgery

## 2022-10-08 VITALS — BP 129/69 | HR 63 | Temp 98.6°F | Resp 18 | Ht 66.0 in | Wt 102.0 lb

## 2022-10-08 DIAGNOSIS — Z9889 Other specified postprocedural states: Secondary | ICD-10-CM

## 2022-10-08 DIAGNOSIS — K449 Diaphragmatic hernia without obstruction or gangrene: Secondary | ICD-10-CM

## 2022-10-08 DIAGNOSIS — R918 Other nonspecific abnormal finding of lung field: Secondary | ICD-10-CM

## 2022-10-10 ENCOUNTER — Ambulatory Visit
Admission: RE | Admit: 2022-10-10 | Discharge: 2022-10-10 | Disposition: A | Discharge status: Still In Hospital | Payer: Medicare PPO | Source: Ambulatory Visit | Attending: Orthopaedic Surgery | Admitting: Orthopaedic Surgery

## 2022-10-10 DIAGNOSIS — M25551 Pain in right hip: Secondary | ICD-10-CM | POA: Insufficient documentation

## 2022-10-10 NOTE — Progress Notes (Signed)
[facility]  Outpatient Physical Therapy  [place], [address]  [phone]  [phone]    Physical Therapy discharge Note    Date: 10/10/2022  Patient's Name: Angela Benson  Date of Birth: Jun 11, 1946  Physical Therapy Discharge       Visit #/POC: 7/ 12  Authorization: 12  POC Signed?:  yes  POC Ends:  4/ 1 / 24  Next Progress Note Due:  10 visits or 10/08/22     Evaluating Physical Therapist: Ronie Spies DPT  PT diagnosis/Reason for Referral: right hip pain   Next Scheduled Physician Appointment:  after PT  Allergies/Contraindications:         Subjective: States she continues to report pain with activity.      Objective: Warm up on Nustep followed by activities as noted below     Strength       right left   Hip flexion  4 5   Hip extension  4- 4+   Hip abduction (supine) 5 5   Hip adduction (sitting) 5 5   Knee flexion  5 5   Knee extension  5 5        EXERCISE/ACTIVITY NAME REPETITIONS RESISTANCE COMPLETED THIS DOS   Nustep    10 min Level 4 y   Standing hip flexor stretch    20 sec x 3 Within pain free ranges  n   Side steps with resistance    4 trips in bars Green above knees n; HEP 09/09/22   Hip hike L/R     10 each Opposite foot on 4 in stool n   FWB R with repeated side step L  FWB R with repeated forward step L    10 each   n   LTR on ball  Bridge on ball    10  10   n   Gluteal bridge      bridge with legs on swiss ball      Glute bridge with band    X12     X12           2x15                   Green bridge  Y     n           n    supported sitting hip 90-90 rotation     x10    n (HEP on 09/19/22)    sitting adductor stretch      2x30 sec    n   Monster walks  X2 length of hall way Red band: x1 around knees, x1 around ankles n   Lateral hip mobilization with belt  Long axis hip distraction grade 2      N        n   Getting on and off the floor and sitting on the floor     n   DKTC with feet on swiss ball  x20   n   Lumbar rotation with legs  on swiss ball x20   n   Mini squat x15   n   Supine march with and without band around feet 2x10 green n   Hooklying hip abduction  Red band y   Hip adduction squeeze   y   SLR x5  y      Assessment: Pt has met 2 goals since starting therapy. She continues to report  pain with activities that has not changed. Her strength has improved but functional mobility continues to be limited due to pain. Pt did see an orthopedic surgeon and is planning on having her hip replaced as soon as she can. Pt discharged at this time and will continue with strengthening at home with HEP.      Short Term Goals: 3 Weeks               -R. Hip PROM by 110 degrees of flexion (not met 10/10/22)               -Patient will be independent in HEP (not met 10/10/22)               -Pt will improve right hip flexion strength to at least 4/5 (Met 10/10/22)     Long Term Goals: 6 Weeks  -LE strength and ROM WFL to allow for normal body mechanics with mobility. (Not met 10/10/22)  -LE strength WFL for symmetric LE WB during sit to stand without UE assist. (Goal met 10/10/22)  - Pt be able to modify movement to reach low book shelves and return to standing with reports of minimal pain. (partially met 10/10/22)       Plan:  Pt discharged today.     Total Session Time 40 and Timed code minutes 40  THERAPEUTIC EXERCISE 40 minutes      Ronie Spies, PT  10/10/2022, 15:23

## 2022-10-13 ENCOUNTER — Encounter (INDEPENDENT_AMBULATORY_CARE_PROVIDER_SITE_OTHER): Payer: Self-pay | Admitting: Surgery

## 2022-10-13 NOTE — Progress Notes (Signed)
GENERAL SURGERY, [place] EXT  [place] [address]    Progress Note    Name: Angela Benson MRN:  [id number]   Date: 10/08/2022 DOB:  08-30-1945 (77 y.o.)                Date of Service:  10/13/2022  Angela Benson, 77 y.o. female  Date of Birth:  11/16/1945  PCP: Elliot Gault, DO  Referring:  No ref. provider found     HPI:  Angela Benson is a 77 y.o. White female who returns after undergoing barium swallow.  The x-ray studies showed no significant esophageal abnormality; minimal sliding type hiatal hernia; small linear scar like density in the left lower lobe and moderate and increased thoracolumbar sigmoid scoliosis.      Past Medical History:   Diagnosis Date    Acquired hypothyroidism     B-complex deficiency     Body mass index (BMI) of 19.9 or less in adult     Chronic neck pain     Dextroscoliosis     Essential hypertension     Irritable bowel syndrome     Low back pain     Midline low back pain without sciatica     Non-rheumatic tricuspid valve insufficiency     Osteopenia     Osteoporosis     Palpitations     Raynaud's syndrome     Retrolisthesis of vertebrae     Sensorineural hearing loss (SNHL) of right ear with unrestricted hearing of left ear     SVT (supraventricular tachycardia)     Urinary frequency       Past Surgical History:   Procedure Laterality Date    COLONOSCOPY      HX APPENDECTOMY      HX HYSTERECTOMY        No outpatient medications have been marked as taking for the 10/08/22 encounter (Office Visit) with Joffrey Kerce B, MD.      Allergies   Allergen Reactions    Nitrofurantoin Hives/ Urticaria     Flu-like symptoms/hives    Penicillins Hives/ Urticaria     Hives/rash           BP 129/69   Pulse 63   Temp 37 C (98.6 F)   Resp 18   Ht 1.676 m (5\' 6" )   Wt 46.3 kg (102 lb)   SpO2 99%   BMI 16.46 kg/m          General: appropriate for age. in no acute distress.    Vital signs are present above and have been reviewed by me      HEENT: Atraumatic, Normocephalic.    Lungs: Nonlabored breathing with symmetric expansion    Heart:Regular wth respect to rate and rythmn.    Abdomen:Soft. Nontender. Nondistended and benign    Psychiatric: Alert and oriented to person, place, and time. affect appropriate       Assessment/Plan:  Assessment/Plan   1. Post-operative state         No significant evidence of GERD.  I discussed with the patient the fact that the small hiatal hernia it was not an indication for any type of surgery.  I recommended that she follow up with her family doctor regarding the scar line density in the left lower lobe.  The radiologist recommended a follow-up PA and lateral chest x-ray in a few weeks.    No follow-ups on file.  This note was partially created using voice recognition software and is inherently subject to errors including those of syntax and "sound alike " substitutions which may escape proof reading. In such instances, original meaning may be extrapolated by contextual derivation.    Keiji Melland B Eulalia Ellerman, MD,MBA,FACS

## 2022-10-15 ENCOUNTER — Other Ambulatory Visit (INDEPENDENT_AMBULATORY_CARE_PROVIDER_SITE_OTHER): Payer: Self-pay | Admitting: Surgery

## 2022-10-15 ENCOUNTER — Telehealth (INDEPENDENT_AMBULATORY_CARE_PROVIDER_SITE_OTHER): Payer: Self-pay | Admitting: Surgery

## 2022-10-15 DIAGNOSIS — J984 Other disorders of lung: Secondary | ICD-10-CM

## 2022-10-15 NOTE — Telephone Encounter (Signed)
-----   Message from Willow Ora, MD sent at 10/13/2022  7:40 PM EDT -----  Regarding: Follow up chest x-ray  The patient we will need to have a follow-up PA and lateral chest x-ray in 4 weeks to evaluate a scar like density in the left lower lobe of her lungs.  Please schedule her for this and make sure my office note gets to Dr. Elliot Gault.  Notify the patient of this planned x-ray

## 2022-10-15 NOTE — Telephone Encounter (Signed)
Order for xray entered. Patient notified that she needs to go to xray in 4 weeks. X rays do not have to be scheduled. Patient voiced understanding. Will send note to Dr. Laverta Baltimore.  Domingo Mend, LPN  [id number]

## 2022-10-18 ENCOUNTER — Encounter (INDEPENDENT_AMBULATORY_CARE_PROVIDER_SITE_OTHER): Payer: Self-pay | Admitting: Family Medicine

## 2022-10-22 ENCOUNTER — Encounter (INDEPENDENT_AMBULATORY_CARE_PROVIDER_SITE_OTHER): Payer: Medicare PPO

## 2022-11-11 ENCOUNTER — Ambulatory Visit: Payer: Medicare PPO | Attending: Family Medicine | Admitting: Family Medicine

## 2022-11-11 ENCOUNTER — Ambulatory Visit (INDEPENDENT_AMBULATORY_CARE_PROVIDER_SITE_OTHER): Payer: Medicare PPO

## 2022-11-11 ENCOUNTER — Other Ambulatory Visit: Payer: Self-pay

## 2022-11-11 ENCOUNTER — Inpatient Hospital Stay (HOSPITAL_COMMUNITY)
Admission: RE | Admit: 2022-11-11 | Discharge: 2022-11-11 | Disposition: A | Discharge status: Home / Self Care | Payer: Medicare PPO | Source: Ambulatory Visit | Attending: Surgery | Admitting: Surgery

## 2022-11-11 DIAGNOSIS — I10 Essential (primary) hypertension: Secondary | ICD-10-CM | POA: Insufficient documentation

## 2022-11-11 DIAGNOSIS — J984 Other disorders of lung: Secondary | ICD-10-CM | POA: Insufficient documentation

## 2022-11-11 LAB — CBC WITH DIFF
BASOPHIL #: 0.1 10*3/uL (ref 0.00–0.10)
BASOPHIL %: 1 % (ref 0–1)
EOSINOPHIL #: 0.2 10*3/uL (ref 0.00–0.50)
EOSINOPHIL %: 2 %
HCT: 40.9 % (ref 31.2–41.9)
HGB: 13.6 g/dL (ref 10.9–14.3)
LYMPHOCYTE #: 1.7 10*3/uL (ref 1.00–3.00)
LYMPHOCYTE %: 27 % (ref 16–44)
MCH: 31.2 pg (ref 24.7–32.8)
MCHC: 33.2 g/dL (ref 32.3–35.6)
MCV: 94 fL (ref 75.5–95.3)
MONOCYTE #: 0.6 10*3/uL (ref 0.30–1.00)
MONOCYTE %: 10 % (ref 5–13)
MPV: 7.7 fL — ABNORMAL LOW (ref 7.9–10.8)
NEUTROPHIL #: 3.8 10*3/uL (ref 1.85–7.80)
NEUTROPHIL %: 60 % (ref 43–77)
PLATELETS: 279 10*3/uL (ref 140–440)
RBC: 4.35 10*6/uL (ref 3.63–4.92)
RDW: 13.6 % (ref 12.3–17.7)
WBC: 6.3 10*3/uL (ref 3.8–11.8)

## 2022-11-11 LAB — LIPID PANEL
CHOL/HDL RATIO: 2.1
CHOLESTEROL: 161 mg/dL (ref <200)
HDL CHOL: 77 mg/dL (ref 23–92)
LDL CALC: 67 mg/dL (ref 0–100)
TRIGLYCERIDES: 83 mg/dL (ref <=150)
VLDL CALC: 17 mg/dL (ref 0–50)

## 2022-11-11 LAB — BASIC METABOLIC PANEL
ANION GAP: 6 mmol/L (ref 4–13)
BUN/CREA RATIO: 29 — ABNORMAL HIGH (ref 6–22)
BUN: 20 mg/dL (ref 7–25)
CALCIUM: 9.3 mg/dL (ref 8.6–10.3)
CHLORIDE: 104 mmol/L (ref 98–107)
CO2 TOTAL: 28 mmol/L (ref 21–31)
CREATININE: 0.68 mg/dL (ref 0.60–1.30)
ESTIMATED GFR: 90 mL/min/{1.73_m2} (ref >59)
GLUCOSE: 92 mg/dL (ref 74–109)
OSMOLALITY, CALCULATED: 278 mOsm/kg (ref 270–290)
POTASSIUM: 4.4 mmol/L (ref 3.5–5.1)
SODIUM: 138 mmol/L (ref 136–145)

## 2022-11-11 LAB — URINALYSIS, MACROSCOPIC
BILIRUBIN: NEGATIVE mg/dL
BLOOD: NEGATIVE mg/dL
GLUCOSE: NEGATIVE mg/dL
KETONES: NEGATIVE mg/dL
LEUKOCYTES: NEGATIVE WBCs/uL
NITRITE: NEGATIVE
PH: 7 (ref 5.0–9.0)
PROTEIN: NEGATIVE mg/dL
SPECIFIC GRAVITY: 1.015 (ref 1.002–1.030)
UROBILINOGEN: NORMAL mg/dL

## 2022-11-11 LAB — URINALYSIS, MICROSCOPIC
RBCS: <1 /hpf (ref <4)
SQUAMOUS EPITHELIAL: <1 /hpf (ref <28)
WBCS: <1 /hpf (ref <6)

## 2022-11-11 LAB — HEPATIC FUNCTION PANEL
ALBUMIN/GLOBULIN RATIO: 1.6 — ABNORMAL HIGH (ref 0.8–1.4)
ALBUMIN: 4.2 g/dL (ref 3.5–5.7)
ALKALINE PHOSPHATASE: 79 U/L (ref 34–104)
ALT (SGPT): 19 U/L (ref 7–52)
AST (SGOT): 28 U/L (ref 13–39)
BILIRUBIN DIRECT: 0.17 md/dL (ref <=0.20)
BILIRUBIN TOTAL: 1.1 mg/dL (ref 0.3–1.2)
BILIRUBIN, INDIRECT: 0.93 mg/dL (ref <=1)
GLOBULIN: 2.7 — ABNORMAL LOW (ref 2.9–5.4)
PROTEIN TOTAL: 6.9 g/dL (ref 6.4–8.9)

## 2022-11-11 LAB — THYROID STIMULATING HORMONE (SENSITIVE TSH): TSH: 2.049 u[IU]/mL (ref 0.450–5.330)

## 2022-11-13 ENCOUNTER — Ambulatory Visit (INDEPENDENT_AMBULATORY_CARE_PROVIDER_SITE_OTHER): Payer: Medicare PPO | Admitting: Family Medicine

## 2022-11-13 ENCOUNTER — Other Ambulatory Visit: Payer: Self-pay

## 2022-11-13 ENCOUNTER — Encounter (INDEPENDENT_AMBULATORY_CARE_PROVIDER_SITE_OTHER): Payer: Self-pay | Admitting: Family Medicine

## 2022-11-13 VITALS — BP 137/75 | HR 62 | Temp 97.9°F | Resp 19 | Ht 66.0 in | Wt 103.0 lb

## 2022-11-13 DIAGNOSIS — M1651 Unilateral post-traumatic osteoarthritis, right hip: Secondary | ICD-10-CM

## 2022-11-13 DIAGNOSIS — R059 Cough, unspecified: Secondary | ICD-10-CM

## 2022-11-13 DIAGNOSIS — E039 Hypothyroidism, unspecified: Secondary | ICD-10-CM

## 2022-11-13 DIAGNOSIS — M542 Cervicalgia: Secondary | ICD-10-CM

## 2022-11-13 DIAGNOSIS — E46 Unspecified protein-calorie malnutrition: Secondary | ICD-10-CM | POA: Insufficient documentation

## 2022-11-13 DIAGNOSIS — I10 Essential (primary) hypertension: Secondary | ICD-10-CM

## 2022-11-13 DIAGNOSIS — M810 Age-related osteoporosis without current pathological fracture: Secondary | ICD-10-CM

## 2022-11-13 DIAGNOSIS — I471 Supraventricular tachycardia, unspecified (CMS HCC): Secondary | ICD-10-CM

## 2022-11-13 MED ORDER — CLOTRIMAZOLE-BETAMETHASONE 1 %-0.05 % TOPICAL CREAM
TOPICAL_CREAM | Freq: Two times a day (BID) | CUTANEOUS | 4 refills | Status: DC
Start: 2022-11-13 — End: 2023-06-18

## 2022-11-13 MED ORDER — FLUTICASONE PROPIONATE 50 MCG/ACTUATION NASAL SPRAY,SUSPENSION
1.0000 | Freq: Every day | NASAL | 4 refills | Status: DC
Start: 2022-11-13 — End: 2023-05-02

## 2022-11-13 MED ORDER — AZELASTINE 137 MCG (0.1 %) NASAL SPRAY
1.0000 | Freq: Two times a day (BID) | NASAL | 4 refills | Status: DC
Start: 2022-11-13 — End: 2023-05-02

## 2022-11-13 MED ORDER — LOSARTAN 100 MG TABLET
100.0000 mg | ORAL_TABLET | Freq: Every day | ORAL | 4 refills | Status: DC
Start: 2022-11-13 — End: 2023-04-23

## 2022-11-13 NOTE — Nursing Note (Signed)
11/13/22 1536   Domestic Violence   Because we are aware of abuse and domestic violence today, we ask all patients: Are you being hurt, hit, or frightened by anyone at your home or in your life?  N   Basic Needs   Do you have any basic needs within your home that are not being met? (such as Food, Shelter, [employer] fast streamer, Tranportation, paying for bills and/or medications) N

## 2022-11-13 NOTE — Progress Notes (Signed)
FAMILY MEDICINE, MEDICAL OFFICE BUILDING  [address]  [place] [address]       Name: Angela Benson MRN:  [id number]   Date: 11/13/2022 Age: 77 y.o.          Provider: Mickey Farber, DO    Reason for visit: Follow Up 6 Months      History of Present Illness:  11/13/2022:  This 77 year old female returns six-month follow-up to review her labs and get refills on all her medications.  She denies chest pain or shortness a breath and is still complaining of cough and I think associated with the ACE inhibitor.  I want to switch her to losartan see if this would reduce her coughing.  She had a barium swallow that showed no obstruction.  She had a chest x-ray that showed chronic changes but 1 of the radiologists said look like chronic obstructive pulmonary disease but she has hyperexpansion of her lungs because she is very tall and thin.  Her CBC was normal with a hemoglobin of 13.6 and hematocrit of 40.9 platelet count 239 1000.  Her electrolytes were normal with a glucose of 92 BUN 20 creatinine 068 calcium was 9.3 GFR was 90 cholesterol was 161 HDL 77 LDL 67 triglycerides 93 TSH was 2.049 liver enzymes are normal urinalysis is normal B12 and vitamin-D were normal.  She has had some eczema type rash on her neck knees and arms and was putting Lotrisone on it and it seems to help.  She saw Dr. Apolinar Junes for right hip pain had 5-6 physical therapy sessions but says is ready for replacement her pain is in her right groin.  Also she continues to have problems with Raynaud syndrome the starting on losartan may help this.    Historical Data    Past Medical History:  Past Medical History:   Diagnosis Date    Acquired hypothyroidism     B-complex deficiency     Body mass index (BMI) of 19.9 or less in adult     Chronic neck pain     Dextroscoliosis     Essential hypertension     Irritable bowel syndrome     Low back pain     Midline low back pain without sciatica     Non-rheumatic tricuspid valve insufficiency      Osteopenia     Osteoporosis     Palpitations     Raynaud's syndrome     Retrolisthesis of vertebrae     Sensorineural hearing loss (SNHL) of right ear with unrestricted hearing of left ear     SVT (supraventricular tachycardia) (CMS HCC)     Urinary frequency          Past Surgical History:  Past Surgical History:   Procedure Laterality Date    COLONOSCOPY      HX APPENDECTOMY      HX HYSTERECTOMY           Allergies:  Allergies   Allergen Reactions    Nitrofurantoin Hives/ Urticaria     Flu-like symptoms/hives    Penicillins Hives/ Urticaria     Hives/rash     Medications:  Current Outpatient Medications   Medication Sig    azelastine (ASTELIN) 137 mcg (0.1 %) Nasal Aerosol, Spray Administer 1 Spray into each nostril Twice daily Use in each nostril as directed Indications: seasonal runny nose    Ca carb-Ca gluc-Mg ox-Mg gluco (CALCIUM MAGNESIUM) 500 mg calcium -250 mg Oral Tablet Take 1 Tablet by mouth Once  a day    cholecalciferol, vitamin D3, 50 mcg (2,000 unit) Oral Tablet Take 1 Tablet (2,000 Units total) by mouth Once a day    clotrimazole-betamethasone (LOTRISONE) 1-0.05 % Cream Apply topically Twice daily    coenzyme Q10 100 mg Oral Capsule Take 1 Capsule (100 mg total) by mouth Once a day    cyanocobalamin (VITAMIN B 12) 1,000 mcg Oral Tablet Take 1 Tablet (1,000 mcg total) by mouth Once a day    fluticasone propionate (FLONASE) 50 mcg/actuation Nasal Spray, Suspension Administer 1 Spray into each nostril Once a day Indications: inflammation of the nose due to an allergy    levothyroxine (SYNTHROID) 50 mcg Oral Tablet Take 1 Tablet (50 mcg total) by mouth Once a day    lisinopriL (PRINIVIL) 20 mg Oral Tablet Take 1 Tablet (20 mg total) by mouth Once a day    losartan (COZAAR) 100 mg Oral Tablet Take 1 Tablet (100 mg total) by mouth Once a day     Family History:  Family Medical History:       Problem Relation (Age of Onset)    Arthritis-osteo Father    Breast Cancer Maternal Aunt    Congestive Heart Failure  Mother    Dementia Mother    Diabetes type II Father    Elevated Lipids Father    Heart Attack Father    Hypertension (High Blood Pressure) Mother, Father    Melanoma Father    No Known Problems Sister, Brother, Maternal Grandmother, Maternal Grandfather, Paternal Grandmother, Paternal Grandfather, Daughter, Son, Maternal Uncle, Paternal Aunt, Paternal Education officer, community, Other            Social History:  Social History     Socioeconomic History    Marital status: Married   Tobacco Use    Smoking status: Never     Passive exposure: Never    Smokeless tobacco: Never   Vaping Use    Vaping status: Never Used   Substance and Sexual Activity    Alcohol use: Never    Drug use: Never           Review of Systems:  Any pertinent Review of Systems as addressed in the HPI above.    Physical Exam:  Vital Signs:  Vitals:    11/13/22 1541   BP: 137/75   Pulse: 62   Resp: 19   Temp: 36.6 C (97.9 F)   TempSrc: Temporal   SpO2: 100%   Weight: 46.7 kg (103 lb)   Height: 1.676 m ( )   BMI: 16.66     Physical Exam  Vitals and nursing note reviewed.   Constitutional:       General: She is awake.      Appearance: Normal appearance. She is well-developed, well-groomed and underweight.   HENT:      Head: Normocephalic and atraumatic.      Right Ear: Tympanic membrane, ear canal and external ear normal.      Left Ear: Tympanic membrane, ear canal and external ear normal.      Nose: Nose normal.      Mouth/Throat:      Mouth: Mucous membranes are moist.      Pharynx: Oropharynx is clear.   Eyes:      Extraocular Movements: Extraocular movements intact.      Conjunctiva/sclera: Conjunctivae normal.      Pupils: Pupils are equal, round, and reactive to light.   Cardiovascular:      Rate and Rhythm: Normal rate and  regular rhythm.      Heart sounds: Normal heart sounds, S1 normal and S2 normal.   Pulmonary:      Effort: Pulmonary effort is normal.      Breath sounds: Normal breath sounds.   Abdominal:      General: Abdomen is flat. Bowel sounds are  normal.      Palpations: Abdomen is soft.   Musculoskeletal:      Cervical back: Normal range of motion and neck supple.      Right hip: Tenderness present. Decreased range of motion. Decreased strength.      Left hip: Tenderness present. Decreased range of motion. Decreased strength.   Skin:     General: Skin is warm and dry.      Capillary Refill: Capillary refill takes less than 2 seconds.   Neurological:      General: No focal deficit present.      Mental Status: She is alert and oriented to person, place, and time. Mental status is at baseline.   Psychiatric:         Mood and Affect: Mood normal.         Behavior: Behavior normal. Behavior is cooperative.         Thought Content: Thought content normal.         Judgment: Judgment normal.       Assessment:    ICD-10-CM    1. Essential hypertension  I10 LIPID PANEL     HEPATIC FUNCTION PANEL     CBC/DIFF     URINALYSIS, MACROSCOPIC AND MICROSCOPIC W/CULTURE REFLEX     BASIC METABOLIC PANEL     THYROID STIMULATING HORMONE (SENSITIVE TSH)      2. SVT (supraventricular tachycardia) (CMS HCC)  I47.10       3. Neck pain of over 3 months duration  M54.2       4. Acquired hypothyroidism  E03.9       5. Protein-calorie malnutrition, unspecified severity (CMS HCC)  E46       6. Age related osteoporosis, unspecified pathological fracture presence  M81.0       7. Post-traumatic osteoarthritis of right hip  M16.51          Plan:  Orders Placed This Encounter    LIPID PANEL    HEPATIC FUNCTION PANEL    CBC/DIFF    URINALYSIS, MACROSCOPIC AND MICROSCOPIC W/CULTURE REFLEX    BASIC METABOLIC PANEL    THYROID STIMULATING HORMONE (SENSITIVE TSH)    clotrimazole-betamethasone (LOTRISONE) 1-0.05 % Cream    azelastine (ASTELIN) 137 mcg (0.1 %) Nasal Aerosol, Spray    fluticasone propionate (FLONASE) 50 mcg/actuation Nasal Spray, Suspension    losartan (COZAAR) 100 mg Oral Tablet   Today I refilled her Astelin nasal spray and I refilled her Flonase nasal spray.  I am going to switch  her from lisinopril to losartan 100.  Today I ordered a lipid panel hepatic function panel CBC with diff urinalysis basic metabolic panel thyroid-stimulating hormone to be done in 6 months.  I do recommend the patient go ahead and get her hip replaced think she will be able to get back normal activity lot quicker.  She lost the fingernail and a left middle finger after a Medicare and a think it doing well starting to heal in.  She will use the Lotrisone on the skin rash is on her neck and arms.  More than 50% of the visit was spent counseling and coordinating care all questions were  answered to the satisfaction of the patient.    Return in about 6 months (around 05/15/2023).    Mickey Farber, DO     Portions of this note may be dictated using voice recognition software or a dictation service. Variances in spelling and vocabulary are possible and unintentional. Not all errors are caught/corrected. Please notify the There[facility] Parkin if any discrepancies are noted or if the meaning of any statement is not clear.

## 2022-11-13 NOTE — Nursing Note (Signed)
11/13/22 1537   Depression Screen   Little interest or pleasure in doing things. 0   Feeling down, depressed, or hopeless 0   PHQ 2 Total 0

## 2022-11-27 ENCOUNTER — Other Ambulatory Visit (INDEPENDENT_AMBULATORY_CARE_PROVIDER_SITE_OTHER): Payer: Self-pay | Admitting: Family Medicine

## 2023-02-25 ENCOUNTER — Other Ambulatory Visit (HOSPITAL_COMMUNITY): Payer: Self-pay | Admitting: Orthopaedic Surgery

## 2023-02-25 ENCOUNTER — Other Ambulatory Visit (INDEPENDENT_AMBULATORY_CARE_PROVIDER_SITE_OTHER): Payer: Self-pay | Admitting: Family Medicine

## 2023-02-25 DIAGNOSIS — M1611 Unilateral primary osteoarthritis, right hip: Secondary | ICD-10-CM

## 2023-04-04 ENCOUNTER — Ambulatory Visit (HOSPITAL_COMMUNITY)
Admission: RE | Admit: 2023-04-04 | Discharge: 2023-04-04 | Disposition: A | Discharge status: Home / Self Care | Payer: Medicare PPO | Source: Ambulatory Visit | Attending: Orthopaedic Surgery | Admitting: Orthopaedic Surgery

## 2023-04-04 ENCOUNTER — Other Ambulatory Visit (HOSPITAL_COMMUNITY): Payer: Medicare PPO

## 2023-04-04 ENCOUNTER — Other Ambulatory Visit: Payer: Self-pay

## 2023-04-04 ENCOUNTER — Ambulatory Visit
Admission: RE | Admit: 2023-04-04 | Discharge: 2023-04-04 | Disposition: A | Discharge status: Home / Self Care | Payer: Medicare PPO | Source: Ambulatory Visit | Attending: Orthopaedic Surgery | Admitting: Orthopaedic Surgery

## 2023-04-04 ENCOUNTER — Other Ambulatory Visit (HOSPITAL_COMMUNITY): Payer: Self-pay | Admitting: Orthopaedic Surgery

## 2023-04-04 DIAGNOSIS — M1610 Unilateral primary osteoarthritis, unspecified hip: Secondary | ICD-10-CM

## 2023-04-04 DIAGNOSIS — R82991 Hypocitraturia: Secondary | ICD-10-CM

## 2023-04-04 DIAGNOSIS — Z01818 Encounter for other preprocedural examination: Secondary | ICD-10-CM

## 2023-04-04 DIAGNOSIS — M1611 Unilateral primary osteoarthritis, right hip: Secondary | ICD-10-CM | POA: Insufficient documentation

## 2023-04-04 LAB — URINALYSIS, MACROSCOPIC
BILIRUBIN: NEGATIVE mg/dL
BLOOD: NEGATIVE mg/dL
GLUCOSE: NEGATIVE mg/dL
KETONES: NEGATIVE mg/dL
LEUKOCYTES: 75 WBCs/uL — AB
NITRITE: NEGATIVE
PH: 5.5 (ref 5.0–9.0)
PROTEIN: NEGATIVE mg/dL
SPECIFIC GRAVITY: 1.019 (ref 1.002–1.030)
UROBILINOGEN: NORMAL mg/dL

## 2023-04-04 LAB — CBC
HCT: 40.7 % (ref 31.2–41.9)
HGB: 13.4 g/dL (ref 10.9–14.3)
MCH: 31.4 pg (ref 24.7–32.8)
MCHC: 32.9 g/dL (ref 32.3–35.6)
MCV: 95.3 fL (ref 75.5–95.3)
MPV: 7.9 fL (ref 7.9–10.8)
PLATELETS: 259 10*3/uL (ref 140–440)
RBC: 4.27 10*6/uL (ref 3.63–4.92)
RDW: 14 % (ref 12.3–17.7)
WBC: 7.5 10*3/uL (ref 3.8–11.8)

## 2023-04-04 LAB — URINALYSIS, MICROSCOPIC
RBCS: 1 /hpf (ref <4)
RENAL EPITHELIAL CELLS URINE: <1 /hpf (ref <6)
SQUAMOUS EPITHELIAL: <1 /hpf (ref <28)
TRANSITIONAL EPITHELIAL CELLS URINE: <1 /hpf (ref <6)
WBCS: 5 /hpf (ref <6)

## 2023-04-04 LAB — BASIC METABOLIC PANEL
ANION GAP: 6 mmol/L (ref 4–13)
BUN/CREA RATIO: 37 — ABNORMAL HIGH (ref 6–22)
BUN: 29 mg/dL — ABNORMAL HIGH (ref 7–25)
CALCIUM: 10.3 mg/dL (ref 8.6–10.3)
CHLORIDE: 105 mmol/L (ref 98–107)
CO2 TOTAL: 27 mmol/L (ref 21–31)
CREATININE: 0.78 mg/dL (ref 0.60–1.30)
ESTIMATED GFR: 79 mL/min/{1.73_m2} (ref >59)
GLUCOSE: 90 mg/dL (ref 74–109)
OSMOLALITY, CALCULATED: 281 mOsm/kg (ref 270–290)
POTASSIUM: 3.9 mmol/L (ref 3.5–5.1)
SODIUM: 138 mmol/L (ref 136–145)

## 2023-04-05 LAB — HGA1C (HEMOGLOBIN A1C WITH EST AVG GLUCOSE): HEMOGLOBIN A1C: 5.6 % (ref 4.0–6.0)

## 2023-04-06 LAB — URINE CULTURE,ROUTINE: URINE CULTURE: 20000 — AB

## 2023-04-08 ENCOUNTER — Encounter (INDEPENDENT_AMBULATORY_CARE_PROVIDER_SITE_OTHER): Payer: Self-pay

## 2023-04-23 ENCOUNTER — Other Ambulatory Visit: Payer: Self-pay

## 2023-04-23 ENCOUNTER — Encounter (INDEPENDENT_AMBULATORY_CARE_PROVIDER_SITE_OTHER): Payer: Self-pay

## 2023-04-23 ENCOUNTER — Ambulatory Visit (INDEPENDENT_AMBULATORY_CARE_PROVIDER_SITE_OTHER): Payer: Medicare PPO

## 2023-04-23 VITALS — BP 137/71 | HR 62 | Temp 98.3°F | Resp 16 | Ht 66.0 in | Wt 103.0 lb

## 2023-04-23 DIAGNOSIS — M169 Osteoarthritis of hip, unspecified: Secondary | ICD-10-CM

## 2023-04-23 DIAGNOSIS — Z8679 Personal history of other diseases of the circulatory system: Secondary | ICD-10-CM

## 2023-04-23 DIAGNOSIS — M1990 Unspecified osteoarthritis, unspecified site: Secondary | ICD-10-CM

## 2023-04-23 DIAGNOSIS — Z23 Encounter for immunization: Secondary | ICD-10-CM

## 2023-04-23 DIAGNOSIS — Z01818 Encounter for other preprocedural examination: Secondary | ICD-10-CM

## 2023-04-23 NOTE — Progress Notes (Signed)
OUTPATIENT PROGRESS NOTE    Subjective:   Patient ID:  Angela Benson is a pleasant 77 y.o. female.    Chief Complaint: Pre-op (Right- THR with Dr.Branson on May 02, 2023.)    Requesting MD: Dr. 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 No personal or family history of complications with anesthesia.  No symptoms of chest pain or dyspnea. No acute complaints today.    Functional capacity:  Can patient perform 4 METS (metabolic energy expenditure unit) without cardiac symptoms. Commonly used items are the ability to walk up 1 flight of stairs holding a bag of groceries or the ability to walk on level ground at 4 miles per hour (or 1 mile in 15 minutes).  Patient can walk up one flight of stairs holding a bag of groceries without experiencing chest pain or pressure.   Patient can walk on level ground at 4 miles per hour without experiencing chest pain or pressure.   Ability to perform these tasks without cardiac symptoms is consistent with a functional capacity of at least 4 METS indicating low risk of cardiac complications during an intermediate risk surgery.  The history is provided by the patient.    Allergies:     Allergies   Allergen Reactions    Nitrofurantoin Hives/ Urticaria     Flu-like symptoms/hives    Penicillins Hives/ Urticaria     Hives/rash     Medications:   azelastine (ASTELIN) 137 mcg (0.1 %) Nasal Aerosol, Spray, Administer 1 Spray into each nostril Twice daily Use in each nostril as directed Indications: seasonal runny nose  Ca carb-Ca gluc-Mg ox-Mg gluco (CALCIUM MAGNESIUM) 500 mg calcium -250 mg Oral Tablet, Take 1 Tablet by mouth Once a day  cholecalciferol, vitamin D3, 50 mcg (2,000 unit) Oral Tablet, Take 1 Tablet (2,000 Units total) by mouth Once a day  clotrimazole-betamethasone (LOTRISONE) 1-0.05 % Cream, Apply topically Twice daily  coenzyme Q10 100 mg Oral Capsule, Take 1 Capsule (100 mg  total) by mouth Once a day  cyanocobalamin (VITAMIN B 12) 1,000 mcg Oral Tablet, Take 1 Tablet (1,000 mcg total) by mouth Once a day  fluticasone propionate (FLONASE) 50 mcg/actuation Nasal Spray, Suspension, Administer 1 Spray into each nostril Once a day Indications: inflammation of the nose due to an allergy  levothyroxine (SYNTHROID) 50 mcg Oral Tablet, Take 1 Tablet (50 mcg total) by mouth Once a day  lisinopriL (PRINIVIL) 20 mg Oral Tablet, TAKE (1) TABLET DAILY  losartan (COZAAR) 100 mg Oral Tablet, Take 1 Tablet (100 mg total) by mouth Once a day (Patient not taking: Reported on 04/23/2023)    No facility-administered medications prior to visit.    Immunization History:     Immunization History   Administered Date(s) Administered    Covid-19 Vaccine,Pfizer(Comirnaty),71mcg/0.3ml,12 yr+,Seasonal 05/07/2022    Covid-19 Vaccine,Pfizer-BioNTech,Purple Top,44yrs+ 07/16/2019, 08/06/2019, 03/28/2020, 12/06/2020    FLUZONE HD VACCINE (ADMIN) 05/17/2014, 04/23/2016, 05/30/2017, 04/16/2018    High-Dose Influenza Vaccine, 65+ 06/02/2020, 04/17/2021    Influenza Vaccine, 6 month-adult 06/02/2020    Influenza Vaccine, 65+ 05/18/2022    Pneumovax 08/13/2018    Shingrix - Zoster Vaccine 08/13/2018     Past Medical History:     Past Medical History:   Diagnosis Date    Acquired hypothyroidism     B-complex deficiency     Body mass index (BMI) of 19.9 or less in adult  Chronic neck pain     Dextroscoliosis     Essential hypertension     Irritable bowel syndrome     Low back pain     Midline low back pain without sciatica     Non-rheumatic tricuspid valve insufficiency     Osteopenia     Osteoporosis     Palpitations     Raynaud's syndrome     Retrolisthesis of vertebrae     Sensorineural hearing loss (SNHL) of right ear with unrestricted hearing of left ear     SVT (supraventricular tachycardia) (CMS HCC)     Urinary frequency      Past Surgical History:     Past Surgical History:   Procedure Laterality Date    COLONOSCOPY       HX APPENDECTOMY      HX HYSTERECTOMY       Family History:     Family Medical History:       Problem Relation (Age of Onset)    Arthritis-osteo Father    Breast Cancer Maternal Aunt    Congestive Heart Failure Mother    Dementia Mother    Diabetes type II Father    Elevated Lipids Father    Heart Attack Father    Hypertension (High Blood Pressure) Mother, Father    Melanoma Father    No Known Problems Sister, Brother, Maternal Grandmother, Maternal Grandfather, Paternal Grandmother, Paternal Grandfather, Daughter, Son, Maternal Uncle, Paternal Aunt, Paternal Uncle, Other          Social History:   Angela Benson  reports that she has never smoked. She has never been exposed to tobacco smoke. She has never used smokeless tobacco. She reports current alcohol use of about 7 glasses of wine per week. She reports that she does not use drugs.    Review of Systems: Other than ROS in HPI, all other systems are negative.    Objective:   Vitals:   Vitals:    04/23/23 0824   BP: 137/71   Pulse: 62   Resp: 16   Temp: 36.8 C (98.3 F)   TempSrc: Temporal   SpO2: 99%   Weight: 46.7 kg (103 lb)   Height: 1.676 m (5\' 6" )   BMI: 16.66     Laboratory:  A1C: 5.6  A1C Date: 04/04/2023    COMPLETE BLOOD COUNT   Lab Results   Component Value Date    WBC 7.5 04/04/2023    HGB 13.4 04/04/2023    HCT 40.7 04/04/2023    PLTCNT 259 04/04/2023     DIFFERENTIAL  Lab Results   Component Value Date    PMNS 60 11/11/2022    LYMPHOCYTES 27 11/11/2022    MONOCYTES 10 11/11/2022    EOSINOPHIL 2 11/11/2022    BASOPHILS 1 11/11/2022    BASOPHILS 0.10 11/11/2022    PMNABS 3.80 11/11/2022    LYMPHSABS 1.70 11/11/2022    EOSABS 0.20 11/11/2022    MONOSABS 0.60 11/11/2022     BASIC METABOLIC PANEL  Lab Results   Component Value Date    SODIUM 138 04/04/2023    POTASSIUM 3.9 04/04/2023    CHLORIDE 105 04/04/2023    CO2 27 04/04/2023    ANIONGAP 6 04/04/2023    BUN 29 (H) 04/04/2023    CREATININE 0.78 04/04/2023    BUNCRRATIO 37 (H) 04/04/2023     GFR 79 04/04/2023    CALCIUM 10.3 04/04/2023    GLUCOSE Negative 04/04/2023    GLUCOSENF 90  04/04/2023       Physical Exam  Vitals reviewed.   Constitutional:       General: She is not in acute distress.     Appearance: Normal appearance.   HENT:      Head: Normocephalic.      Nose: Nose normal.      Mouth/Throat:      Mouth: Mucous membranes are moist.   Eyes:      Pupils: Pupils are equal, round, and reactive to light.   Cardiovascular:      Rate and Rhythm: Normal rate and regular rhythm.      Pulses: Normal pulses.      Heart sounds: Normal heart sounds.   Pulmonary:      Effort: Pulmonary effort is normal.      Breath sounds: Normal breath sounds.   Abdominal:      General: Abdomen is flat. Bowel sounds are normal.      Palpations: Abdomen is soft.   Skin:     General: Skin is warm and dry.      Capillary Refill: Capillary refill takes less than 2 seconds.   Neurological:      General: No focal deficit present.      Mental Status: She is alert and oriented to person, place, and time. Mental status is at baseline.   Psychiatric:         Attention and Perception: Attention normal.         Mood and Affect: Mood and affect normal.         Speech: Speech normal.         Behavior: Behavior normal. Behavior is cooperative.         Thought Content: Thought content normal.       Assessment & Plan:   EKG completed today which revealed a normal sinus rhythm with sinus arrhythmia and a heart rate of 62.  Pt is an acceptable risk for surgery.    Additional work up not indicated; co-morbidities are optimized.  Proceed with surgery as planned.  No food or liquids the morning of surgery.  Call surgeon if develops respiratory illness, fever, or other illness.  Hold the following medications the morning of surgery: Lisinopril    Health Maintenance   Topic Date Due    Adult Tdap-Td (1 - Tdap) Never done    Shingles Vaccine (2 of 2) 10/08/2018    Pneumococcal Vaccination, Age 9+ (2 of 2 - PCV) 08/14/2019    Medicare Annual  Wellness Visit - Calendar Year Insurers  Never done    Influenza Vaccine (1) 03/30/2023    Covid-19 Vaccine (6 - 2023-24 season) 03/30/2023    Depression Screening  11/13/2023    Osteoporosis screening  05/22/2024    Hepatitis C screening  Discontinued     Return if symptoms worsen or fail to improve.  Ignacia Marvel, APRN,NP-C

## 2023-04-23 NOTE — Nursing Note (Signed)
04/23/23 0823   Recent Weight Change   Have you had a recent unexplained weight loss or gain? N   Health Education and Literacy   How often do you have a problem understanding what is told to you about your medical condition?  Never   Domestic Violence   Because we are aware of abuse and domestic violence today, we ask all patients: Are you being hurt, hit, or frightened by anyone at your home or in your life?  N   Basic Needs   Do you have any basic needs within your home that are not being met? (such as Food, Shelter, Civil [employer] fast streamer, Tranportation, paying for bills and/or medications) N   Advanced Directives   Do you have any advanced directives? Living Will   Do you have the Advanced Directive(s) so we can scan them to your chart? N   Would you like an advanced directive packet? Refused Packet

## 2023-04-30 ENCOUNTER — Encounter (HOSPITAL_COMMUNITY): Payer: Self-pay

## 2023-05-01 NOTE — H&P (Signed)
[facility] [address] [place], [place] [address] [phone]  Name: Angela Benson DOB: November 14, 1945 Gender: F MR#: [id number]  Provider: Alexia Freestone, MD,FAAOS  Encounter Date: 02/25/23  Referral:   Care team:   COVID VISIT INFO: Modified OV due to COVID restrictions, limited contact exam, VS not recorded, Patient not candidate for TELE visit. PPE and precautions used to minimize risk of transmission.  CHIEF COMPLAINT  Right Hip pain; 02/25/23; pain; PJB  HISTORY  pain  Right hip pain x66 year:77 year old female referred by April Crist for right hip symptoms. Patient is a very active 77 year old female with a history of back problems mostly in her left leg. She notes difficulties with [place] st yle sitting yoga pose with decreased external rotation and flexion of the hip. She has some discomfort or vague feeling that so mething is not right in the groin area. Sx have increased and now interfere with more activities of daily living.  She has chronic back problems, and can differentiate the hip from back issues. Most back sx were on left side as well. Most of pain is groin and anterior thigh. She has occaisional painful cathces.   Patient has MRI of the hip which shows some cysts forming anterior portion of the hip with some subchondral cyst in the acetabulum and moderate narrowing of the joint space. The x-rays show findings that are not quite as severe.  patient now has increase in symptoms but symptoms sometimes radiate all the way down to the foot. She feels most these are related to the hip. Hip x-rays show moderate arthritis, the MRI as noted above shows more significant degenerative change. She developed significant painful catch with forward flexion motions of her back.  the patient had severe adverse reaction to steroids and therefore does not wish to undergo injectio n. She has been doing exercise program for the hip. Her function continues to deteriorate. She is significant  difficulty i nitiating gait. The painful catch in the hip with a either getting in the car or trying to straighten out the hip. She has diffi culty getting shoes and socks on and off. Pain is interfering with activities of daily living.  PAST MEDICAL HISTORY  Denies history of cancer Cardiac: HypertensionControlled by meds. Hypothryoid COVID vaccine: Pfizer x 3 Hx of COVID COVID X2, uneventful reocvery Musculoskeletal: Osteoarthritis, Osteoporosis and Raynaud syndrome   1  Denies Ankylosing Spondylitis, Fibromyalgia, Gout, Lupus, Lyme's, Mixed connective tissue disease, Polymyalgia, Psoriatic arthritis, Scleroderma, Sjogren's/ Sicca and Vasculitis Rheumatologic: Denies N/A  PAST SURGICAL HISTORY  Date Side/ Site Procedure Details Doctor Facility Complications 07/29/76 tubal ligatiion 07/29/65 Abdominal Adhesions 07/29/62 Appendectomy  ALLERGIES  Name Reactions Notes nitrofurantoin Hives Penicillins Rash / hives  MEDICATIONS  Name calcium carbonate-vit D3-min 600-200-40-7.5 mg-un 1 oral qd ------INTERACTIONS: Drug-drug interaction to Synthroid; Cal-500 500 mg calcium (1,25 1 oral qd ------Minerals & Electrolytes - Calcium Replacement D3 DOTS 2,000 unit 1 oral qd ------Vitamins - D Derivatives Q-Sorb Co Q-10 100 mg 1 oral qd ------Alternative Therapy - Unclassified B12-Resin 1,000 mcg 1 oral qd ------Vitamins - B-12, Cyanocobalamin and derivatives Synthroid 50 mcg 1 oral qd ------Thyroid Hormones - Synthetic T4 (Thyroxine) ------INTERACTIONS: Drug-drug interaction to Cal-500; Prinivil 20 mg 1 oral qd ------ACE Inhibitors Strength QTY Sig  Date 2020-12-26  2020-12-13  2020-12-13  2020-12-13  2020-12-13  2020-12-13  2020-12-13  Family and Social History  Patient is Married. Non-smoker Recode: 4 ETOH: <2oz/ week Lives with family Mother deceased age 69;Pneumonia  Father: deceased age 55,causeofdeathheart attack. 1 brothers; No sisters. Diabetes in father. Hypertension in mother, father and brother  2  MI in father  Cholesterol father Neurologic : inmother Other, father, Skin No known family history of Stroke, Bleeding, Blood clots  REVIEW OF SYSTEMS  General: No history of change in weight, fever, chills, change in energy, fatigue or changes in sleep habits. Opthalmic: Negative for change in vision / eye problems. ENT: Denies congestion, sinus, throat and hearing issues. Respiratory: Denies SOB, cough and other respiratory symptoms. Cardiac: Denies chest pain, CHF and other cardiac symptoms. GI: No changes in bowel habits, abdominal pain or other GI complaint. No history of prior pregnancy. Musculoskeletal: Denies significant pain, joint or muscular symptoms. Neurological: Denies cephalgia, seizures, weakness, numbness and other neurologic symptoms. Endocrine: Denies polyuria, polydypsia and other endocrine related complaints. Behavioral: Denies significant mood and psychological symptoms. Hemeatologic: Denies anemia, bleeding / clotting problems and other blood related complaints. Stop Bang interpretation / modified: Low probability of OSA: Score 1 RCRI, no risk factors, risk of cardiac death, nonfatal MI and cardiac arrest, 0.4%. Risk of MI, Pulmonary edema, ventricular fib, cardiac arrest and complete heart block 0.5% POUR / IPSS score 0. Score classified as mild level of symptoms, with no or mild increase in risk of urinary retention. DVT Prophylaxis : Based on current recommendation and patient specific risk factors, DVT prophlyaxis reccommended is Ecotrin 81 BID X 4 wk ASA II : Mild systemic disease Charlson score: Enter PMFSH then select calculate score resulting in estimated in hospital mortality rate of 1.2%  EXAM  BMI 16.3; Temp: ; HR ; BP /; H: 5\' 6" ; Wt 101lb pO2 / RA  Difficulty initiating gait. He has an intermittent painful catch in the hip resulting in given a twist of the pelvis. Right hip: pain with medial rotation of the hip. Hip internally rotates only to 0.. IR reproduces pain. Limitations increased  compared to prior exam. Straight leg raising is negative. Deep tendon reflexes are intact.  Mild MCP swelling, but no warmth or synovitis. General exam   BMI   Cardiac, respiratory and mental status NORMAL  X-RAYS  Date Name Review 02/25/2023 R HIP Pelvis 2 view Right Hip. Kelligren Larsen grade 4 end stage arthritis with significant joint space loss, cysts and spurs. Femoral head shows osteophyte and subchondral cysts. Acetabulum shows central osteophyte, marginal osteophyte and subchondral cysts. 11/26/2022 Pelvis R HIP Pelvis 3 view Right Hip. Kelligren Larsen grade 3 moderate changes with some joint space narrowing and early to moderate bone changes. Femoral head shows osteophyte and subchondral cysts. Acetabulum shows central osteophyte, marginal osteophyte and subchondral cysts. 08/28/2022 Pelvis XR AP Leg lengths appear equal. Lumbar spine shows moderate degenerative change. Left hip: Left hip Kelligren Larsen grade 3 moderate changes with some joint space narrowing and early to moderate bone changes. Right Hip. Right hip Kelligren Larsen grade 3 moderate changes with some joint space narrowing and early to moderate bone changes. Lumbar spine shows moderate degenerative change. 3  Other Treatments reviewed  Date Name Review  ASSESSMENT  Moderate grade arthritis right hip, declining function. 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  All medical problems under active treatment thoughts of the optimized for surgery.  Hemoglobin A1c 5.6.  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  Patient has significant lumbar spine problems which may contribute to the intermittent symptoms in the right leg. She does not have evidence of  active radiculopathy at this time. The hip appears to be the major component of the groin and anterior leg pain. We carefully discussed what symptoms would be relative related to the hip and therefore improved versus symptoms related to the back which would be unimproved or potentially worse following surgery. Discussion about Mako robotic procedure and the recent data that shows a significant improvement in risk of dislocation wi th robotic surgery. We discussed the specific surgical risks of infection, possible intraoperative fractures, sciatic nerve injury, and thromboembolic disease. We discussed the center of excellence program to optimize results and minimize complications shared decision making process carried out. HOOS is recorded. Surgical informed consent is obtained. Patient stratif ies to Aspirin. She Has Help at Home with Her Husband for Postop Recovery. Discharge Plan Will Be Home.      Orders:   Date Name Review 04/04/2023 R hip CT WO contrast MAKO protocl N/A 02/25/2023 R HIP Pelvis 2 view Right Hip. Kelligren Larsen grade 4 end stage arthritis with significant joint space loss, cysts and spurs. Femoral head shows osteophyte and subchondral cysts. Acetabulum shows central osteophyte, marginal osteophyte and subchondral cysts. 11/26/2022 Pelvis R HIP Pelvis 3 view Right Hip. Kelligren Larsen grade 3 moderate changes with some joint space narrowing and early to moderate bone changes. Femoral head shows osteophyte and subchondral cysts. Acetabulum shows central osteophyte, marginal osteophyte and subchondral cysts. 08/28/2022 Pelvis XR AP Leg lengths appear equal. Lumbar spine shows moderate degenerative change. Left hip: Left hip Kelligren Larsen grade 3 moderate changes with some joint space narrowing and early to moderate bone changes. Right Hip. Right hip Kelligren Larsen grade 3 moderate changes with some joint space narrowing and early to moderate bone changes. Lumbar spine shows moderate  degenerative change. 12/26/2020 Pelvis XR AP Leg lengths appear equal. Lumbar spine shows moderate degenerative change. Left hip: Left hip Kelligren Larsen grade 3 moderate changes with some joint space narrowing and early to moderate bone changes. Right Hip. Right hip Kelligren Larsen grade 3 moderate changes with some joint space narrowing and early to moderate bone changes. Lumbar spine shows moderate degenerative change. 11/14/2020 R Hip MR without contrast Review of outside study & report. OA KL grade Kelligren Larsen grade 3 4  moderate changes with some joint space narrowing and early to moderate bone changes. Femoral head with subchondral cysts. Acetabulum showssubchondral cysts and bone edema.  01/14/2020 R Hip XR 2V Right Hip. 1. Mild left and moderate right hip joint arthritic change the latter associated with increased sclerosis and subchondral cysts at the articulating surfaces. 2. Lower lumbar spondylosis and scoliosis. 3. No acute bony abnormalities. 07/07/2019 Bone mineral density Based on minimum relevant T-score of -3.1 in the the right hip, patient's bone mineral density is in the osteoporotic range. Scores overall are a little decreased from previous. Fracture risk is elevated. Moderate and worsened lumbar dextroscoliosis  Date Name Review 02/25/2023 Bedside commode N/A 02/25/2023 Wheeled walker N/A 08/28/2022 LSSP R HIP PT EVAL TX N/A  RETURN / FOLLOW-UP  Right Total Hip Arthroplasty Scheduled  Alexia Freestone, MD,FAAOS  Waldron Session, M.D. 02/25/2023 01:23:41      [id number]  V13  5    Attestation:  Patient has been examined before anesthesia, and there are no changes in medical status or indications for surgery.

## 2023-05-02 ENCOUNTER — Inpatient Hospital Stay (HOSPITAL_COMMUNITY): Payer: Medicare PPO | Admitting: Anesthesiology

## 2023-05-02 ENCOUNTER — Observation Stay
Admission: RE | Admit: 2023-05-02 | Discharge: 2023-05-03 | Disposition: A | Discharge status: Home / Self Care | Payer: Medicare PPO | Attending: Orthopaedic Surgery | Admitting: Orthopaedic Surgery

## 2023-05-02 ENCOUNTER — Encounter (HOSPITAL_COMMUNITY): Payer: Self-pay | Admitting: Orthopaedic Surgery

## 2023-05-02 ENCOUNTER — Encounter (HOSPITAL_COMMUNITY)
Admission: RE | Disposition: A | Discharge status: Home / Self Care | Payer: Self-pay | Source: Home / Self Care | Attending: Orthopaedic Surgery

## 2023-05-02 ENCOUNTER — Other Ambulatory Visit: Payer: Self-pay

## 2023-05-02 ENCOUNTER — Observation Stay (HOSPITAL_COMMUNITY): Payer: Medicare PPO | Admitting: Orthopaedic Surgery

## 2023-05-02 DIAGNOSIS — I471 Supraventricular tachycardia, unspecified: Secondary | ICD-10-CM | POA: Insufficient documentation

## 2023-05-02 DIAGNOSIS — E785 Hyperlipidemia, unspecified: Secondary | ICD-10-CM | POA: Insufficient documentation

## 2023-05-02 DIAGNOSIS — M810 Age-related osteoporosis without current pathological fracture: Secondary | ICD-10-CM | POA: Insufficient documentation

## 2023-05-02 DIAGNOSIS — K589 Irritable bowel syndrome without diarrhea: Secondary | ICD-10-CM | POA: Insufficient documentation

## 2023-05-02 DIAGNOSIS — G8929 Other chronic pain: Secondary | ICD-10-CM | POA: Insufficient documentation

## 2023-05-02 DIAGNOSIS — E039 Hypothyroidism, unspecified: Secondary | ICD-10-CM | POA: Insufficient documentation

## 2023-05-02 DIAGNOSIS — Z96649 Presence of unspecified artificial hip joint: Secondary | ICD-10-CM | POA: Diagnosis present

## 2023-05-02 DIAGNOSIS — Z96641 Presence of right artificial hip joint: Principal | ICD-10-CM

## 2023-05-02 DIAGNOSIS — I7300 Raynaud's syndrome without gangrene: Secondary | ICD-10-CM | POA: Insufficient documentation

## 2023-05-02 DIAGNOSIS — M51369 Other intervertebral disc degeneration, lumbar region without mention of lumbar back pain or lower extremity pain: Secondary | ICD-10-CM | POA: Insufficient documentation

## 2023-05-02 DIAGNOSIS — M1611 Unilateral primary osteoarthritis, right hip: Principal | ICD-10-CM | POA: Insufficient documentation

## 2023-05-02 DIAGNOSIS — M4150 Other secondary scoliosis, site unspecified: Secondary | ICD-10-CM | POA: Insufficient documentation

## 2023-05-02 DIAGNOSIS — I10 Essential (primary) hypertension: Secondary | ICD-10-CM | POA: Insufficient documentation

## 2023-05-02 SURGERY — ARTHROPLASTY HIP ANTERIOR TOTAL MAKO ROBOTIC ASSISTED
Anesthesia: Spinal | Site: Hip | Laterality: Right | Wound class: Clean Wound: Uninfected operative wounds in which no inflammation occurred

## 2023-05-02 MED ORDER — SODIUM CHLORIDE 0.9 % (FLUSH) INJECTION SYRINGE
3.0000 mL | INJECTION | Freq: Three times a day (TID) | INTRAMUSCULAR | Status: DC
Start: 2023-05-02 — End: 2023-05-02

## 2023-05-02 MED ORDER — CELECOXIB 100 MG CAPSULE
200.0000 mg | ORAL_CAPSULE | Freq: Once | ORAL | Status: DC
Start: 2023-05-02 — End: 2023-05-02
  Administered 2023-05-02: 0 mg via ORAL

## 2023-05-02 MED ORDER — MIDAZOLAM 5 MG/ML INJECTION WRAPPER
1.0000 mg | Freq: Once | INTRAMUSCULAR | Status: DC | PRN
Start: 2023-05-02 — End: 2023-05-02

## 2023-05-02 MED ORDER — BISACODYL 10 MG RECTAL SUPPOSITORY
10.0000 mg | Freq: Every day | RECTAL | Status: DC | PRN
Start: 2023-05-02 — End: 2023-05-03
  Filled 2023-05-02: qty 1

## 2023-05-02 MED ORDER — OXYCODONE ER 10 MG TABLET,CRUSH RESISTANT,EXTENDED RELEASE 12 HR
EXTENDED_RELEASE_ORAL_TABLET | ORAL | Status: AC
Start: 2023-05-02 — End: 2023-05-02
  Filled 2023-05-02: qty 1

## 2023-05-02 MED ORDER — PROPOFOL 10 MG/ML IV BOLUS
INJECTION | Freq: Once | INTRAVENOUS | Status: DC | PRN
Start: 2023-05-02 — End: 2023-05-02
  Administered 2023-05-02 (×4): 10 mg via INTRAVENOUS
  Administered 2023-05-02: 20 mg via INTRAVENOUS
  Administered 2023-05-02 (×16): 10 mg via INTRAVENOUS

## 2023-05-02 MED ORDER — TRANEXAMIC ACID 1,000 MG/10 ML (100 MG/ML) INTRAVENOUS SOLUTION
INTRAVENOUS | Status: AC
Start: 2023-05-02 — End: 2023-05-02
  Filled 2023-05-02: qty 10

## 2023-05-02 MED ORDER — CLOTRIMAZOLE-BETAMETHASONE 1 %-0.05 % TOPICAL CREAM
TOPICAL_CREAM | Freq: Two times a day (BID) | CUTANEOUS | Status: DC
Start: 2023-05-02 — End: 2023-05-03
  Administered 2023-05-02 – 2023-05-03 (×3): 0 via TOPICAL
  Filled 2023-05-02: qty 15

## 2023-05-02 MED ORDER — VANCOMYCIN 10 GRAM INTRAVENOUS SOLUTION
1.0000 g | Freq: Once | INTRAVENOUS | Status: AC
Start: 2023-05-02 — End: 2023-05-02
  Administered 2023-05-02: 750 mg via INTRAVENOUS
  Filled 2023-05-02: qty 10

## 2023-05-02 MED ORDER — ONDANSETRON HCL (PF) 4 MG/2 ML INJECTION SOLUTION
8.0000 mg | Freq: Once | INTRAMUSCULAR | Status: AC
Start: 2023-05-02 — End: 2023-05-02
  Administered 2023-05-02: 8 mg via INTRAVENOUS

## 2023-05-02 MED ORDER — PROCHLORPERAZINE EDISYLATE 10 MG/2 ML (5 MG/ML) INJECTION SOLUTION
5.0000 mg | Freq: Once | INTRAMUSCULAR | Status: DC | PRN
Start: 2023-05-02 — End: 2023-05-02

## 2023-05-02 MED ORDER — TRANEXAMIC ACID 1,000 MG/10 ML (100 MG/ML) INTRAVENOUS SOLUTION
1000.0000 mg | Freq: Once | INTRAVENOUS | Status: DC
Start: 2023-05-02 — End: 2023-05-02

## 2023-05-02 MED ORDER — ACETAMINOPHEN 325 MG TABLET
975.0000 mg | ORAL_TABLET | Freq: Four times a day (QID) | ORAL | Status: AC
Start: 2023-05-02 — End: 2023-05-02
  Administered 2023-05-02: 0 mg via ORAL
  Administered 2023-05-02: 975 mg via ORAL
  Filled 2023-05-02: qty 3

## 2023-05-02 MED ORDER — FENTANYL (PF) 50 MCG/ML INJECTION WRAPPER
INJECTION | Freq: Once | INTRAMUSCULAR | Status: DC | PRN
Start: 2023-05-02 — End: 2023-05-02
  Administered 2023-05-02: 80 ug via INTRAVENOUS

## 2023-05-02 MED ORDER — SODIUM CHLORIDE 0.9 % (FLUSH) INJECTION SYRINGE
3.0000 mL | INJECTION | INTRAMUSCULAR | Status: DC | PRN
Start: 2023-05-02 — End: 2023-05-03

## 2023-05-02 MED ORDER — OXYCODONE 5 MG TABLET
10.0000 mg | ORAL_TABLET | ORAL | Status: DC | PRN
Start: 2023-05-02 — End: 2023-05-03
  Filled 2023-05-02: qty 2

## 2023-05-02 MED ORDER — ACETAMINOPHEN 325 MG TABLET
975.0000 mg | ORAL_TABLET | Freq: Once | ORAL | Status: AC
Start: 2023-05-02 — End: 2023-05-02
  Administered 2023-05-02: 975 mg via ORAL

## 2023-05-02 MED ORDER — ROPIVACAINE (PF) 2 MG/ML (0.2 %) INJECTION SOLUTION
Freq: Once | INTRAMUSCULAR | Status: DC | PRN
Start: 2023-05-02 — End: 2023-05-02
  Administered 2023-05-02: 10 mL

## 2023-05-02 MED ORDER — NALOXONE 0.4 MG/ML INJECTION SOLUTION
0.4000 mg | INTRAMUSCULAR | Status: DC | PRN
Start: 2023-05-02 — End: 2023-05-03

## 2023-05-02 MED ORDER — SODIUM CHLORIDE 0.9 % (FLUSH) INJECTION SYRINGE
3.0000 mL | INJECTION | Freq: Three times a day (TID) | INTRAMUSCULAR | Status: DC
Start: 2023-05-02 — End: 2023-05-03
  Administered 2023-05-02 – 2023-05-03 (×2): 0 mL

## 2023-05-02 MED ORDER — ACETAMINOPHEN 325 MG TABLET
650.0000 mg | ORAL_TABLET | ORAL | Status: DC | PRN
Start: 2023-05-02 — End: 2023-05-03
  Administered 2023-05-03: 650 mg via ORAL
  Filled 2023-05-02: qty 2

## 2023-05-02 MED ORDER — OXYCODONE ER 10 MG TABLET,CRUSH RESISTANT,EXTENDED RELEASE 12 HR
10.0000 mg | EXTENDED_RELEASE_ORAL_TABLET | Freq: Once | ORAL | Status: DC
Start: 2023-05-02 — End: 2023-05-02
  Administered 2023-05-02: 0 mg via ORAL

## 2023-05-02 MED ORDER — SODIUM CHLORIDE 0.9 % INTRAVENOUS PIGGYBACK
2.0000 g | Freq: Once | INTRAVENOUS | Status: DC
Start: 2023-05-02 — End: 2023-05-02

## 2023-05-02 MED ORDER — DEXAMETHASONE SODIUM PHOSPHATE (PF) 10 MG/ML INJECTION SOLUTION
10.0000 mg | Freq: Once | INTRAMUSCULAR | Status: AC
Start: 2023-05-02 — End: 2023-05-02
  Administered 2023-05-02: 10 mg via INTRAVENOUS

## 2023-05-02 MED ORDER — DOCUSATE SODIUM 100 MG CAPSULE
100.0000 mg | ORAL_CAPSULE | Freq: Two times a day (BID) | ORAL | Status: DC
Start: 2023-05-02 — End: 2023-05-03
  Administered 2023-05-02: 0 mg via ORAL
  Administered 2023-05-02: 100 mg via ORAL
  Administered 2023-05-03: 0 mg via ORAL
  Filled 2023-05-02 (×2): qty 1

## 2023-05-02 MED ORDER — BUTORPHANOL 2 MG/ML INJECTION SOLUTION
0.2000 mg | Freq: Four times a day (QID) | INTRAMUSCULAR | Status: AC | PRN
Start: 2023-05-02 — End: 2023-05-03

## 2023-05-02 MED ORDER — DEXAMETHASONE SODIUM PHOSPHATE (PF) 10 MG/ML INJECTION SOLUTION
INTRAMUSCULAR | Status: AC
Start: 2023-05-02 — End: 2023-05-02
  Filled 2023-05-02: qty 1

## 2023-05-02 MED ORDER — LEVOTHYROXINE 50 MCG TABLET
50.0000 ug | ORAL_TABLET | Freq: Every day | ORAL | Status: DC
Start: 2023-05-02 — End: 2023-05-03
  Administered 2023-05-02 – 2023-05-03 (×2): 0 ug via ORAL
  Filled 2023-05-02: qty 1

## 2023-05-02 MED ORDER — LACTATED RINGERS INTRAVENOUS SOLUTION
INTRAVENOUS | Status: DC
Start: 2023-05-02 — End: 2023-05-02

## 2023-05-02 MED ORDER — ETHYL ALCOHOL 62 % (NOZIN NASAL SANITIZER) NASAL SOLUTION - BULK BOTTLE
1.0000 | Freq: Once | NASAL | Status: AC
Start: 2023-05-02 — End: 2023-05-02
  Administered 2023-05-02: 1 via NASAL

## 2023-05-02 MED ORDER — HYDROCODONE 7.5 MG-ACETAMINOPHEN 325 MG TABLET
1.0000 | ORAL_TABLET | Freq: Four times a day (QID) | ORAL | 0 refills | Status: DC | PRN
Start: 2023-05-02 — End: 2023-05-03

## 2023-05-02 MED ORDER — ACETAMINOPHEN 325 MG TABLET
ORAL_TABLET | ORAL | Status: AC
Start: 2023-05-02 — End: 2023-05-02
  Filled 2023-05-02: qty 3

## 2023-05-02 MED ORDER — ONDANSETRON HCL (PF) 4 MG/2 ML INJECTION SOLUTION
INTRAMUSCULAR | Status: AC
Start: 2023-05-02 — End: 2023-05-02
  Filled 2023-05-02: qty 4

## 2023-05-02 MED ORDER — FENTANYL (PF) 50 MCG/ML INJECTION SOLUTION
INTRAMUSCULAR | Status: AC
Start: 2023-05-02 — End: 2023-05-02
  Filled 2023-05-02: qty 2

## 2023-05-02 MED ORDER — ASPIRIN 81 MG TABLET,DELAYED RELEASE
81.0000 mg | DELAYED_RELEASE_TABLET | Freq: Two times a day (BID) | ORAL | Status: DC
Start: 2023-05-02 — End: 2023-12-18

## 2023-05-02 MED ORDER — NALOXONE 0.4 MG/ML INJECTION SOLUTION
0.2000 mg | Freq: Once | INTRAMUSCULAR | Status: DC | PRN
Start: 2023-05-02 — End: 2023-05-03

## 2023-05-02 MED ORDER — GABAPENTIN 300 MG CAPSULE
ORAL_CAPSULE | ORAL | Status: AC
Start: 2023-05-02 — End: 2023-05-02
  Filled 2023-05-02: qty 1

## 2023-05-02 MED ORDER — MIDAZOLAM 5 MG/ML INJECTION WRAPPER
Freq: Once | INTRAMUSCULAR | Status: DC | PRN
Start: 2023-05-02 — End: 2023-05-02
  Administered 2023-05-02 (×2): .5 mg via INTRAVENOUS

## 2023-05-02 MED ORDER — FENTANYL (PF) 50 MCG/ML INJECTION WRAPPER
50.0000 ug | INJECTION | INTRAMUSCULAR | Status: DC | PRN
Start: 2023-05-02 — End: 2023-05-02

## 2023-05-02 MED ORDER — SODIUM CHLORIDE 0.9 % INTRAVENOUS PIGGYBACK
INJECTION | INTRAVENOUS | Status: AC
Start: 2023-05-02 — End: 2023-05-02
  Filled 2023-05-02: qty 50

## 2023-05-02 MED ORDER — POTASSIUM CHLORIDE 20 MEQ/L IN 0.9 % SODIUM CHLORIDE INTRAVENOUS
INTRAVENOUS | Status: DC
Start: 2023-05-02 — End: 2023-05-02

## 2023-05-02 MED ORDER — SODIUM CHLORIDE 0.9 % (FLUSH) INJECTION SYRINGE
3.0000 mL | INJECTION | INTRAMUSCULAR | Status: DC | PRN
Start: 2023-05-02 — End: 2023-05-02

## 2023-05-02 MED ORDER — TRAMADOL 50 MG TABLET
50.0000 mg | ORAL_TABLET | Freq: Four times a day (QID) | ORAL | Status: DC
Start: 2023-05-02 — End: 2023-05-03
  Administered 2023-05-02: 0 mg via ORAL
  Administered 2023-05-02: 50 mg via ORAL
  Administered 2023-05-03: 0 mg via ORAL
  Administered 2023-05-03: 50 mg via ORAL
  Filled 2023-05-02 (×4): qty 1

## 2023-05-02 MED ORDER — FAMOTIDINE (PF) 20 MG/2 ML INTRAVENOUS SOLUTION
20.0000 mg | Freq: Once | INTRAVENOUS | Status: AC
Start: 2023-05-02 — End: 2023-05-02
  Administered 2023-05-02: 20 mg via INTRAVENOUS

## 2023-05-02 MED ORDER — FAMOTIDINE (PF) 20 MG/2 ML INTRAVENOUS SOLUTION
INTRAVENOUS | Status: AC
Start: 2023-05-02 — End: 2023-05-02
  Filled 2023-05-02: qty 2

## 2023-05-02 MED ORDER — MORPHINE 2 MG/ML INJECTION WRAPPER
2.0000 mg | INJECTION | INTRAMUSCULAR | Status: DC | PRN
Start: 2023-05-02 — End: 2023-05-03

## 2023-05-02 MED ORDER — MULTIVITAMIN WITH FOLIC ACID 400 MCG TABLET
1.0000 | ORAL_TABLET | Freq: Every day | ORAL | Status: DC
Start: 2023-05-02 — End: 2023-05-03
  Administered 2023-05-02 – 2023-05-03 (×2): 0 via ORAL
  Filled 2023-05-02: qty 1

## 2023-05-02 MED ORDER — LACTATED RINGERS INTRAVENOUS SOLUTION
INTRAVENOUS | Status: DC
Start: 2023-05-02 — End: 2023-05-03

## 2023-05-02 MED ORDER — ONDANSETRON HCL (PF) 4 MG/2 ML INJECTION SOLUTION
4.0000 mg | INTRAMUSCULAR | Status: DC | PRN
Start: 2023-05-02 — End: 2023-05-03
  Administered 2023-05-02 – 2023-05-03 (×2): 4 mg via INTRAVENOUS
  Filled 2023-05-02 (×3): qty 2

## 2023-05-02 MED ORDER — MIDAZOLAM 5 MG/ML INJECTION WRAPPER
INTRAMUSCULAR | Status: AC
Start: 2023-05-02 — End: 2023-05-02
  Filled 2023-05-02: qty 1

## 2023-05-02 MED ORDER — FAMOTIDINE 20 MG TABLET
20.0000 mg | ORAL_TABLET | Freq: Two times a day (BID) | ORAL | Status: DC
Start: 2023-05-02 — End: 2023-05-04
  Administered 2023-05-02: 0 mg via ORAL
  Administered 2023-05-02: 20 mg via ORAL
  Administered 2023-05-03: 0 mg via ORAL
  Filled 2023-05-02 (×2): qty 1

## 2023-05-02 MED ORDER — CELECOXIB 100 MG CAPSULE
ORAL_CAPSULE | ORAL | Status: AC
Start: 2023-05-02 — End: 2023-05-02
  Filled 2023-05-02: qty 2

## 2023-05-02 MED ORDER — APREPITANT 40 MG CAPSULE
ORAL_CAPSULE | ORAL | Status: AC
Start: 2023-05-02 — End: 2023-05-02
  Filled 2023-05-02: qty 1

## 2023-05-02 MED ORDER — EPHEDRINE SULFATE 50 MG/ML INTRAVENOUS SOLUTION
Freq: Once | INTRAVENOUS | Status: DC | PRN
Start: 2023-05-02 — End: 2023-05-02
  Administered 2023-05-02: 10 mg via INTRAVENOUS

## 2023-05-02 MED ORDER — SODIUM CHLORIDE 0.9 % (FLUSH) INJECTION SYRINGE
3.0000 mL | INJECTION | Freq: Three times a day (TID) | INTRAMUSCULAR | Status: DC
Start: 2023-05-02 — End: 2023-05-03
  Administered 2023-05-02 – 2023-05-03 (×3): 0 mL

## 2023-05-02 MED ORDER — ROPIVACAINE (PF) 2 MG/ML (0.2 %) INJECTION SOLUTION
INTRAMUSCULAR | Status: AC
Start: 2023-05-02 — End: 2023-05-02
  Filled 2023-05-02: qty 10

## 2023-05-02 MED ORDER — ONDANSETRON HCL (PF) 4 MG/2 ML INJECTION SOLUTION
4.0000 mg | Freq: Once | INTRAMUSCULAR | Status: DC | PRN
Start: 2023-05-02 — End: 2023-05-02

## 2023-05-02 MED ORDER — IPRATROPIUM 0.5 MG-ALBUTEROL 3 MG (2.5 MG BASE)/3 ML NEBULIZATION SOLN
3.0000 mL | INHALATION_SOLUTION | Freq: Once | RESPIRATORY_TRACT | Status: DC | PRN
Start: 2023-05-02 — End: 2023-05-02

## 2023-05-02 MED ORDER — GABAPENTIN 300 MG CAPSULE
300.0000 mg | ORAL_CAPSULE | Freq: Once | ORAL | Status: DC
Start: 2023-05-02 — End: 2023-05-02
  Administered 2023-05-02: 0 mg via ORAL

## 2023-05-02 MED ORDER — BUPIVACAINE (PF) 0.75 % (7.5 MG/ML) IN 8.25 % DEXTROSE INJECTION
Freq: Once | INTRAMUSCULAR | Status: AC | PRN
Start: 2023-05-02 — End: 2023-05-02
  Administered 2023-05-02: 1.6 mL via INTRATHECAL

## 2023-05-02 MED ORDER — ALBUTEROL SULFATE 2.5 MG/3 ML (0.083 %) SOLUTION FOR NEBULIZATION
2.5000 mg | INHALATION_SOLUTION | Freq: Once | RESPIRATORY_TRACT | Status: DC | PRN
Start: 2023-05-02 — End: 2023-05-02

## 2023-05-02 MED ORDER — HYDROMORPHONE 2 MG/ML INJECTION WRAPPER
0.2000 mg | INJECTION | INTRAMUSCULAR | Status: DC | PRN
Start: 2023-05-02 — End: 2023-05-03

## 2023-05-02 MED ORDER — ETHYL ALCOHOL 62 % (NOZIN NASAL SANITIZER) NASAL SOLUTION - BULK BOTTLE
1.0000 | Freq: Two times a day (BID) | NASAL | Status: DC
Start: 2023-05-02 — End: 2023-05-03
  Administered 2023-05-02: 0 via NASAL
  Administered 2023-05-02 – 2023-05-03 (×2): 1 via NASAL

## 2023-05-02 MED ORDER — APREPITANT 40 MG CAPSULE
40.0000 mg | ORAL_CAPSULE | Freq: Once | ORAL | Status: AC
Start: 2023-05-02 — End: 2023-05-02
  Administered 2023-05-02: 40 mg via ORAL

## 2023-05-02 MED ORDER — ASPIRIN 81 MG CHEWABLE TABLET
81.0000 mg | CHEWABLE_TABLET | Freq: Two times a day (BID) | ORAL | Status: DC
Start: 2023-05-03 — End: 2023-05-03
  Administered 2023-05-03: 81 mg via ORAL
  Filled 2023-05-02: qty 1

## 2023-05-02 MED ORDER — LISINOPRIL 5 MG TABLET
20.0000 mg | ORAL_TABLET | Freq: Every day | ORAL | Status: DC
Start: 2023-05-02 — End: 2023-05-03
  Administered 2023-05-02: 0 mg via ORAL
  Administered 2023-05-03: 20 mg via ORAL
  Filled 2023-05-02: qty 4

## 2023-05-02 MED ORDER — FENTANYL (PF) 50 MCG/ML INJECTION WRAPPER
INJECTION | Freq: Once | INTRAMUSCULAR | Status: AC | PRN
Start: 2023-05-02 — End: 2023-05-02
  Administered 2023-05-02: 20 ug via INTRATHECAL

## 2023-05-02 MED ORDER — HYDROCODONE 5 MG-ACETAMINOPHEN 325 MG TABLET
1.0000 | ORAL_TABLET | ORAL | Status: DC | PRN
Start: 2023-05-02 — End: 2023-05-03

## 2023-05-02 SURGICAL SUPPLY — 63 items
ADH BOND LF  MESH TAPE EZ REM DRMBND 42CM (WOUND CARE SUPPLY) ×1 IMPLANT
BLADE 10 2 END CBNSTL SURG STRL DISP (SURGICAL CUTTING SUPPLIES) ×5 IMPLANT
BLADE SAW 3.543X.828IN SGTL THK.05IN STRYK PRFRM SER STERNUM SYS 6 (SURGICAL CUTTING SUPPLIES) ×1 IMPLANT
BLADE SAW 73X25MM 2 CUT SGTL SS THK.89MM MED W STRL LF (SURGICAL CUTTING SUPPLIES) IMPLANT
BLADE SAW STD MAKO STRL LF  DISP (SURGICAL CUTTING SUPPLIES) IMPLANT
CONV USE 65308 - DRAPE FNFLD ABS REINF 77X53IN 43528 PRXM LF  STRL DISP SURG SMS 44X23IN (DRAPE/PACKS/SHEETS/OR TOWEL) ×3
CONV USE ITEM 337687 - DRAPE REINF FNFLD 90X44IN LF  STRL DISP SURG (DRAPE/PACKS/SHEETS/OR TOWEL) ×3
COUNTER 20 CNT BLOCK ADH NEEDLE STRL LF  RD SHARP FOAM 15.75X11.5X14IN DISP (MED SURG SUPPLIES) ×1 IMPLANT
DETERGENT INSTR 22OZ TRNSPT GEL RINSE FREE NEUT PH PREKLENZ CLR PLSNT LF (MISCELLANEOUS PT CARE ITEMS) ×1 IMPLANT
DEVICE SUCT 2 FILTER CHAMBER SCKR STRL LF  DISP (MED SURG SUPPLIES) ×1 IMPLANT
DRAPE 2 INCS FILM ANTIMIC 33X23IN IOBN STRL SURG (DRAPE/PACKS/SHEETS/OR TOWEL) ×1 IMPLANT
DRAPE FNFLD ABS REINF 77X53IN 43528 PRXM LF  STRL DISP SURG SMS 44X23IN (DRAPE/PACKS/SHEETS/OR TOWEL) ×3 IMPLANT
DRAPE REINF FNFLD 90X44IN LF  STRL DISP SURG (DRAPE/PACKS/SHEETS/OR TOWEL) ×3 IMPLANT
DRAPE TWL PLASTIC ADH 23X17IN LRG STRDRP STRL SURG TRNSPR (DRAPE/PACKS/SHEETS/OR TOWEL) IMPLANT
DRESS COMPRESS 13FTX6IN JNS COTTON RL HI ABS LF  STRL .5LB (WOUND CARE SUPPLY) IMPLANT
DRESS SECURE 2.75X2.5IN TGDRM IV FILM TAPE STRP STRL LF (IV TUBING & ACCESSORIES) ×1 IMPLANT
ELECTRODE ESURG BLADE 6.5IN MEGADYNE EZ CLEAN STRL PTFE DISP (SURGICAL CUTTING SUPPLIES) ×1 IMPLANT
ELECTRODE ESURG BLADE PNCL 15FT VLAB EDGE TELESCP SMOKE EVAC (SURGICAL CUTTING SUPPLIES) ×1 IMPLANT
GLOVE SURG 6 LF  PF SMOOTH BEAD CUF INTLK STRL BLU 11.3IN PROTEXIS NEU-THERA PLISPRN THK7.9 MIL (GLOVES AND ACCESSORIES) ×1 IMPLANT
GLOVE SURG 6.5 LF  PF BEAD CUF STRL CRM 11.3IN PROTEXIS PI PLISPRN THK9.1 MIL (GLOVES AND ACCESSORIES) IMPLANT
GLOVE SURG 6.5 LF  PF SMOOTH BEAD CUF INTLK STRL BLU 11.3IN PROTEXIS NEU-THERA PLISPRN THK7.9 MIL (GLOVES AND ACCESSORIES) ×3 IMPLANT
GLOVE SURG 6.5 LTX PF SMOOTH BEAD CUF STRL YW 11.5IN PROTEXIS NEU-THERA DDRGL THK8.7 MIL (GLOVES AND ACCESSORIES) IMPLANT
GLOVE SURG 7 LF  PF SMOOTH BEAD CUF INTLK STRL BLU 11.8IN PROTEXIS NEU-THERA PLISPRN THK7.9 MIL (GLOVES AND ACCESSORIES) IMPLANT
GLOVE SURG 7.5 LF  PF BEAD CUF STRL CRM 11.8IN PROTEXIS PI PLISPRN THK9.1 MIL (GLOVES AND ACCESSORIES) ×1 IMPLANT
GLOVE SURG 7.5 LF  PF SMOOTH BEAD CUF INTLK STRL BLU 11.8IN PROTEXIS NEU-THERA PLISPRN THK7.9 MIL (GLOVES AND ACCESSORIES) ×1 IMPLANT
GLOVE SURG 7.5 LTX PF SMOOTH BEAD CUF STRL YW 12IN PROTEXIS (GLOVES AND ACCESSORIES) IMPLANT
GLOVE SURG 8 LTX PF SMOOTH BEAD CUF 3 PLY STRL LIGHT BRN 11.6IN PROTEXIS NATURAL RUB NTR THK9.8 MIL (GLOVES AND ACCESSORIES) ×2 IMPLANT
GLOVE SURG 8.5 LF  PF SMOOTH BEAD CUF INTLK STRL BLU 11.8IN PROTEXIS NEU-THERA PLISPRN THK7.9 MIL (GLOVES AND ACCESSORIES) ×1 IMPLANT
GOWN SURG 2XL STD LGTH REG L3 NONREINFORCE BRTHBL TWL STRL LF  DISP BLU HALYARD SPECTRUM SMS (DRAPE/PACKS/SHEETS/OR TOWEL) ×1 IMPLANT
GOWN SURG XL XLNG L4 REINF HKLP CLSR SET IN SLEEVE STRL LF  DISP BLU SIRUS SMS PE 56IN (DRAPE/PACKS/SHEETS/OR TOWEL) ×1 IMPLANT
HEAD V40 36MM +7.5MM OFST TAPER HIP BLX D FEM STRL (IMPLANTS HIP) ×1 IMPLANT
HEMOSTAT ABS 14X2IN FLXB SHR W_V SRGCL STRL DISP (WOUND CARE SUPPLY) ×1 IMPLANT
INSERT ACET TRIDENT 36MM 0D D X3 HIP STRL LF (IMPLANTS HIP) ×1 IMPLANT
KIT 1 PC PCKT DRP RIO RIO (DRAPE/PACKS/SHEETS/OR TOWEL) ×1 IMPLANT
MARKER SURG CKPNT HEX IMPCT 3.5MM STRL (MED SURG SUPPLIES) ×1 IMPLANT
MAT INSTR TRY 44X36IN WTPRF BACKSHEET TPNX BLU (MISCELLANEOUS PT CARE ITEMS) ×1 IMPLANT
MATTRESS TRANSF 78X34IN AIRPAL C1000LB LONG STD 2 BELT 2 HOSE ATTACHMENT PNT SNAP BUTTON LBL DISP (MED SURG SUPPLIES) ×1 IMPLANT
NAVIGATE VIZADISC HIP TRK KIT SYSTEM STRL LF  DISP (MED SURG SUPPLIES) ×1 IMPLANT
NEEDLE HYPO  23GA 1.5IN TW PRCSNGL SS POLYPROP REG BVL LL HUB DEHP-FR TRQS STRL LF  DISP (MED SURG SUPPLIES) ×1 IMPLANT
PIN FIX 170MM 4MM KNEE STRL (IMPLANTS TRAUMA) IMPLANT
PLUG DOME H TRIDENT II HEX STRL LF (IMPLANTS HIP) ×1 IMPLANT
SET INTPLS SUCT TUBE FAN SPRAY TIP HANDPC STRL LF  DISP (MED SURG SUPPLIES) ×1 IMPLANT
SHELL ACET 48MM HIP SLD BCK TRIT TRIDENT II D STRL (IMPLANTS HIP) ×1 IMPLANT
SOL IRRG 0.9% NACL 2000ML PRSV FR N-PYRG FLXB CONTAINR STRL LF (MEDICATIONS/SOLUTIONS) ×1 IMPLANT
SOL SURG PREP 26ML DRPRP 74% ISPRP 0.7% IOD POVACRYLEX SLF CNTN APPL SKIN STRL PREOP (MED SURG SUPPLIES) ×1 IMPLANT
SPONGE LAP 18X18IN PREWASH RIGID TRY STRL LF  WHT (MED SURG SUPPLIES) ×3 IMPLANT
STEM FEM ACCOLADE V40 108MM PRFT PUREFIX TI HIP 127D 5 44MM HI OFST STRL (IMPLANTS HIP) ×1 IMPLANT
STKNT ORTHO 48X12IN PLSTR COTTON DRP IMPRV LRG STRL (ORTHOPEDICS (NOT IMPLANTS)) IMPLANT
SUTURE 1 CT STRATAFIX PDS + 18IN VIOL ABS KNOTLESS TISS CONTROL SMTR ABS (SUTURE/WOUND CLOSURE) ×1 IMPLANT
SUTURE 1 CT VICRYL 36IN VIOL BRD COAT ABS (SUTURE/WOUND CLOSURE) ×2 IMPLANT
SUTURE 2-0 CT1 STRATAFIX PDS + 18IN VIOL ABS KNOTLESS TISS CONTROL SMTR ABS (SUTURE/WOUND CLOSURE) ×1 IMPLANT
SUTURE 2-0 CT1 VICRYL 27IN UNDYED BRD COAT ABS (SUTURE/WOUND CLOSURE) ×2 IMPLANT
SUTURE 3-0 PS2 MONOCRYL MTPS 27IN UNDYED MONOF ABS (SUTURE/WOUND CLOSURE) ×1 IMPLANT
SUTURE 4-0 FS1 PROLENE 18IN BLU MONOF NONAB (SUTURE/WOUND CLOSURE) ×2 IMPLANT
SUTURE 5 V-37 ETHIBOND 30IN GRN BRD 4 STRN NONAB (SUTURE/WOUND CLOSURE) ×1 IMPLANT
SYRINGE LL 10ML LF  STRL GRAD N-PYRG DEHP-FR PVC FREE MED DISP (MED SURG SUPPLIES) ×1 IMPLANT
TIP PLS LAV INTPLS LONG FEM CNL COAX STRL DISP (MED SURG SUPPLIES) IMPLANT
TOWEL 24X16IN COTTON BLU DISP SURG STRL LF (DRAPE/PACKS/SHEETS/OR TOWEL) ×2 IMPLANT
TRAY SINGLE BASIN ~~LOC~~ - ~~LOC~~ COMMUNITY HOSP (CUSTOM TRAYS & PACK) ×1 IMPLANT
TRAY ~~LOC~~ ARTHROSCOPY ~~LOC~~ - ~~LOC~~ COMMUNITY HOSP (CUSTOM TRAYS & PACK) ×1 IMPLANT
TUBING SUCT CLR 6FT .25IN ARGYLE PVC NCDTV STR MALE FEMALE MLD CONN STRL LF (MED SURG SUPPLIES) IMPLANT
WAX BONE HMST AGENT LUKENS STRL (WOUND CARE SUPPLY) ×1 IMPLANT
WOUND IRRG IRRISEPT DBRD CLNSG 0.05% CHG SYSTEM STRL LF (WOUND CARE SUPPLY) ×1 IMPLANT

## 2023-05-02 NOTE — Anesthesia Postprocedure Evaluation (Signed)
Anesthesia Post Op Evaluation    Patient: Angela Benson  Procedure(s):  MAKO ROBOTIC ASSISTED RIGHT TOTAL HIP ARTHROPLASTY USING PRESSFIT ACCOLADE II  STEM, TRIDENT II SHEEL, X3 POLY INSERT, AND BIOLOX CERAMIC HEAD  IMPLANTS    Last Vitals:Temperature: 36.3 C (97.3 F) (05/02/23 0938)  Heart Rate: 80 (05/02/23 0950)  BP (Non-Invasive): 118/74 (05/02/23 0950)  Respiratory Rate: 18 (05/02/23 0950)  SpO2: 98 % (05/02/23 0950)    No notable events documented.    Patient is sufficiently recovered from the effects of anesthesia to participate in the evaluation and has returned to their pre-procedure level.  Patient location during evaluation: PACU       Patient participation: complete - patient participated  Level of consciousness: awake and alert and responsive to verbal stimuli    Pain management: adequate  Airway patency: patent    Anesthetic complications: no  Cardiovascular status: acceptable  Respiratory status: acceptable  Hydration status: acceptable  Patient post-procedure temperature: Pt Normothermic   PONV Status: Absent

## 2023-05-02 NOTE — Care Plan (Signed)
Medical Nutrition Therapy Assessment        SUBJECTIVE : Patient triggered due to low BMI but states she has always been thin and has not lost or gained any weight.  Has good appetite is a picky eater and does a mostly vegetarian diet with some use of dairy products.  She does not like or wish for any oral supplements.  Feels her oral intake is adequate has no visible s/s malnutrition.      OBJECTIVE:   Current Diet Order/Nutrition Support:  DIET REGULAR Do you want to initiate MNT Protocol? Yes   Height Used for Calculations: 167.6 cm (5\' 6" )  Weight Used For Calculations: 46.3 kg (102 lb)  BMI (kg/m2): 16.5     Ideal Body Weight (IBW) (kg): 59.58  % Ideal Body Weight: 77.66     Physical Assessment: No temporal or buccal fat pad wasting noted, Hair appears healthy not thin or brittle    Plan/Interventions : Continue current diet cater to food prefs.  RD following to monitor oral intake.    Fredia Beets, RD  05/02/2023, 12:29

## 2023-05-02 NOTE — Anesthesia Procedure Notes (Signed)
Angela Benson    Neuraxial Block    Performed by:   Performing Provider: Nydia Bouton, DO   Authorizing provider: Nydia Bouton, DO      Sedation        Blocks   Block: spinal   Type of block: single shot   Indication:primary anesthetic         Technique:  Pt location: In OR  Approach: midline        Preprocedure hand washing was performed sterile field maintained   Needle level: L3-4  Skin Prep: Sterilely prepped and draped, Sterile field established, Hand hygiene performed, Sterile gloves, Cap, Mask, Sterile drape and Sterile technique   Preanesthesia Checklist:  Pre anesthesia checklist: site marked, surgical consent, monitors and equipment checked, timeout performed, anesthesia consent, emergency drugs available and positioning concerns  Position:  Positioning: sitting   Skin Local:  Skin local: Lidocaine 1%   Spinal Needle:  Spinal needle:Weiss    Spinal Needle gauge: 25 G     Spinal needle attempts: 1.  Epidural Needle:                Epidural Catheter:              Block Events:     Test dose:                Medications    bupivacaine PF 0.75%-dextrose 8.25% (MARCAINE SPINAL) spinal injection - Intrathecal   1.6 mL - 05/02/2023 7:36:00 AM  fentaNYL (SUBLIMAZE) 50 mcg/mL injection - Intrathecal   20 mcg - 05/02/2023 7:36:00 AM  Dosing   Catheter secured.       NOTES

## 2023-05-02 NOTE — Anesthesia Transfer of Care (Signed)
ANESTHESIA TRANSFER OF CARE   Angela Benson is a 77 y.o. ,female, Weight: 46.3 kg (102 lb)   had Procedure(s):  MAKO ROBOTIC ASSISTED RIGHT TOTAL HIP ARTHROPLASTY USING PRESSFIT ACCOLADE II  STEM, TRIDENT II SHEEL, X3 POLY INSERT, AND BIOLOX CERAMIC HEAD  IMPLANTS  performed  05/02/23   Primary Service: Alexia Freestone, MD    Past Medical History:   Diagnosis Date   . Acquired hypothyroidism    . B-complex deficiency    . Body mass index (BMI) of 19.9 or less in adult    . Chronic neck pain    . Dextroscoliosis    . Essential hypertension    . Irritable bowel syndrome    . Low back pain    . Midline low back pain without sciatica    . Non-rheumatic tricuspid valve insufficiency    . Osteopenia    . Osteoporosis    . Palpitations    . Raynaud's syndrome    . Retrolisthesis of vertebrae    . Sensorineural hearing loss (SNHL) of right ear with unrestricted hearing of left ear    . SVT (supraventricular tachycardia) (CMS HCC)    . Urinary frequency       Allergy History as of 05/02/23       NITROFURANTOIN         Noted Status Severity Type Reaction    09/24/21 1342 [address] [place], [place] 09/24/21 Active High  Hives/ Urticaria    Comments: Flu-like symptoms/hives               PENICILLINS         Noted Status Severity Type Reaction    09/24/21 1342 [address] [place], [place] 09/24/21 Active High  Hives/ Urticaria    Comments: Hives/rash                   I completed my transfer of care / handoff to the receiving personnel during which we discussed:  Access, Airway, All key/critical aspects of case discussed, Analgesia, Antibiotics, Expectation of post procedure, Fluids/Product, Gave opportunity for questions and acknowledgement of understanding, Labs and PMHx  Report given to: Benay Pike, RN    Post Location: PACU                                                           Last OR Temp: Temperature: 36.9 C (98.5 F)  ABG:  POTASSIUM   Date Value Ref Range Status   04/04/2023 3.9 3.5 - 5.1 mmol/L Final     KETONES   Date  Value Ref Range Status   04/04/2023 Negative Negative, Trace mg/dL Final     CALCIUM   Date Value Ref Range Status   04/04/2023 10.3 8.6 - 10.3 mg/dL Final     Airway:* No LDAs found *  Blood pressure (!) 148/79, pulse 69, temperature 36.9 C (98.5 F), resp. rate 16, height 1.676 m (5\' 6" ), weight 46.3 kg (102 lb), SpO2 96%.

## 2023-05-02 NOTE — PT Evaluation (Signed)
[facility]  [address]  [place], [address]  [phone]  (Fax) [phone]  Rehabilitation Services  Physical Therapy Inpatient THA Initial Evaluation    Patient Name: Angela Benson  Date of Birth: [birth year]  Height: Height: 167.6 cm (5\' 6" )  Weight: Weight: 46.3 kg (102 lb)  Room/Bed: 375/A  Payor: [employer] / Plan: [employer] PPO / Product Type: PPO /       PMH:  Past Medical History:   Diagnosis Date    Acquired hypothyroidism     B-complex deficiency     Body mass index (BMI) of 19.9 or less in adult     Chronic neck pain     Dextroscoliosis     Essential hypertension     Irritable bowel syndrome     Low back pain     Midline low back pain without sciatica     Non-rheumatic tricuspid valve insufficiency     Osteopenia     Osteoporosis     Palpitations     Raynaud's syndrome     Retrolisthesis of vertebrae     Sensorineural hearing loss (SNHL) of right ear with unrestricted hearing of left ear     SVT (supraventricular tachycardia) (CMS HCC)     Urinary frequency            Assessment:      (P) Patient was admitted for an elective right THA and has done well postoperatively. She participated  well in PT evaluation. Prior to assessment, therapist instructed and discussed THA dislocation precautions. Patient completed bed mobility, transfers and ambulation with Min/CGA and min cues to maintain THA precautions. She will benefit from continued PT services for education and training following THA protocol. She plans return to home with her husband at discharge.    Discharge Needs:    Equipment Recommendation: (P) front wheeled walker      The patient presents with mobility limitations due to impaired range of motion, impaired strength, weight bearing restrictions, impaired functional activity tolerance, pain and THA dislocation precautions that significantly impair/prevent patient's ability to participate in mobility-related  activities of daily living (MRADLs) including  ambulation and transfers in order to safely complete, toileting, bathing, food preparation, working, laundering/household tasks, safely entering/exiting the home, driving . This functional mobility deficit can be improved with the use of a (P) front wheeled walker  in order to decrease the risk of falls in performance of these MRADLs.  Patient is able to safely use this assistive device.    Discharge Disposition: (P) home with assist    JUSTIFICATION OF DISCHARGE RECOMMENDATION   Based on current diagnosis, functional performance prior to admission, and current functional performance, this patient requires continued PT services in (P) home with assist in order to achieve significant functional improvements in these deficit areas: (P) gait, locomotion, and balance, muscle performance, ROM (range of motion).        Plan:   Current Intervention: (P) bed mobility training, gait training, home exercise program, patient/family education, ROM (range of motion), stair training, strengthening, stretching, transfer training  To provide physical therapy services (P) other (see comments) (1-3x/day Monday through Saturday)  for duration of (P) until discharge.    The risks/benefits of therapy have been discussed with the patient/caregiver and he/she is in agreement with the established plan of care.       Subjective & Objective     Past Medical History:   Diagnosis Date  Acquired hypothyroidism     B-complex deficiency     Body mass index (BMI) of 19.9 or less in adult     Chronic neck pain     Dextroscoliosis     Essential hypertension     Irritable bowel syndrome     Low back pain     Midline low back pain without sciatica     Non-rheumatic tricuspid valve insufficiency     Osteopenia     Osteoporosis     Palpitations     Raynaud's syndrome     Retrolisthesis of vertebrae     Sensorineural hearing loss (SNHL) of right ear with unrestricted hearing of left ear     SVT  (supraventricular tachycardia) (CMS HCC)     Urinary frequency             Past Surgical History:   Procedure Laterality Date    COLONOSCOPY      HX APPENDECTOMY      HX HYSTERECTOMY          05/02/23 1357   Rehab Session   Document Type evaluation   PT Visit Date 05/02/23   General Information   Patient Profile Reviewed yes   Pertinent History of Current Functional Problem Patient was admitted on 05/02/23 to undergo an 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 Order received to begin PT services on DOS.   Medical Lines PIV Line   Respiratory Status room air   Existing Precautions/Restrictions posterior hip precautions;weight bearing restriction;fall precautions  (WBAT RLE)   Mutuality/Individual Preferences   Anxieties, Fears or Concerns Concerned about having a good recovery   Individualized Care Needs Physical Therapy, Pain Management   Living Environment   Lives With spouse   Living Arrangements house  (single story)   Home Accessibility stairs to enter home   Home Main Entrance   Stair Railings, Main Entrance none   Number of Stairs, Main Entrance three   Functional Level Prior   Ambulation 0 - independent   Transferring 0 - independent   Toileting 0 - independent   Bathing 0 - independent   Dressing 0 - independent   Eating 0 - independent   Communication 0 - understands/communicates without difficulty   Prior Functional Level Comment Patient reports that she was very active, walking daily and working at [employer] part time. She also has several other projects for the community that she leads or participates.   Pre Treatment Status   Pre Treatment Patient Status Patient supine in bed   Support Present Pre Treatment  None   Communication Pre Treatment  Nurse   Communication Pre Treatment Comment cleared for PT evaluation   Cognitive Assessment/Interventions   Behavior/Mood Observations behavior appropriate to situation, WNL/WFL;alert;cooperative    Orientation Status oriented x 4   Attention WNL/WFL   Follows Commands WNL   Pre- Treatment Vital Signs   Pre-Treatment Heart Rate (beats/min) 81   Pre SpO2 (%) 98   O2 Delivery Pre Treatment room air   Pre-Treatment Pain   Pretreatment Pain Rating 7/10   Pre/Posttreatment Pain Comment right hip, pain did not increase or decrease with activity but she did say that it felt looser.   RUE Assessment   RUE Assessment WFL- Within Functional Limits   LUE Assessment   LUE Assessment WFL- Within Functional Limits   RLE Assessment   RLE Assessment X-Exceptions   RLE ROM Hip  (knee and ankle WNL)  RLE Strength hip 3+/5, knee 4/5 and ankle 5/5   Right Hip Flexion 85   Right Hip Extension 0   LLE Assessment   LLE Assessment WFL- Within Functional Limits   Trunk Assessment   Trunk Assessment WFL-Within Functional Limits   Mobility Assessment/Training   Additional Documentation Bed Mobility Assessment/Treatment (Group);Transfer Assessment/Treatment (Group);Gait Assessment/Treatment (Group)   Bed Mobility Assessment/Treatment   Bed Mobility, Assistive Device bed rails   Comment min cues to maintain THA dislocation precautions   Scoot/Bridge Independence contact guard assist   Sit to Supine, Independence minimum assist (75% patient effort);contact guard assist   Supine-Sit Independence minimum assist (75% patient effort);contact guard assist   Transfer Assessment/Treatment   Transfer Comment min cues to maintain THA dislocation precautions   Sit-Stand-Sit, Assist Device walker, front wheeled   Sit-Stand Independence contact guard assist   Stand-Sit Independence contact guard assist   Toilet Transfer Assist Device walker, front wheeled   Toilet Transfer Independence minimum assist (75% patient effort);contact guard assist  (bed to [facility])   Gait Assessment/Treatment   Total Distance Ambulated 200   Assistive Device  walker, front wheeled   Comment Initiaited with 3 point step to pattern, progressed to a 2 point step through pattern.    Distance in Feet 200 feet   Independence  contact guard assist   Post Treatment Status   Post Treatment Patient Status Patient supine in bed;Call light within reach;Patient safety alarm activated   Support Present Post Treatment  None   Communication Post Treatement Nurse   Communication Post Treatment Comment updated on eval status and informed nurse of patient's request for pain meds   Patient Effort excellent   Post-Treatment Vital Signs   Post-treatment Heart Rate (beats/min) 86   Post SpO2 (%) 96   O2 Delivery Post Treatment room air   Post-Treatment Pain   Posttreatment Pain Rating 7/10   Physical Therapy Clinical Impression   Assessment Patient was admitted for an elective right THA and has done well postoperatively. She participated  well in PT evaluation. Prior to assessment, therapist instructed and discussed THA dislocation precautions. Patient completed bed mobility, transfers and ambulation with Min/CGA and min cues to maintain THA precautions. She will benefit from continued PT services for education and training following THA protocol. She plans return to home with her husband at discharge.   Criteria for Skilled Therapeutic meets criteria   Impairments Found (describe specific impairments) gait, locomotion, and balance;muscle performance;ROM (range of motion)   Functional Limitations in Following  self-care;home management;work;community/leisure   Rehab Potential good   Therapy Frequency other (see comments)  (1-3x/day Monday through Saturday)   Predicted Duration of Therapy Intervention (days/wks) until discharge   Anticipated Equipment Needs at Discharge (PT) front wheeled walker   Anticipated Discharge Disposition home with assist   Evaluation Complexity Justification   Patient History: Co-morbidity/factors that impact Plan of Care 1-2 that impact Plan of Care   Examination Components 3 or more Exam elements addressed   Presentation Stable: Uncomplicated, straight-forward, problem focused    Clinical Decision Making Low complexity   Evaluation Complexity Low complexity   Planned Therapy Interventions, PT Eval   Planned Therapy Interventions (PT) bed mobility training;gait training;home exercise program;patient/family education;ROM (range of motion);stair training;strengthening;stretching;transfer training   Physical Therapy Time and Intention   Total PT Minutes: 53       TOTAL HIP ARTHROPLASTY (THA) GOALS    1) AMBULATE 300 FEET WITH WALKER AND SBA/S.  2) TRANSFER BED TO/FROM CHAIR WITH SBA/S  WHILE MAINTAINING THA PRECAUTIONS.Marland Kitchen   3) NEGOTIATES TRAINING STEPS USING HANDRAIL AND SBA/S.       Physical Therapy Inpatient THA D/C Criteria        PATIENT/CAREGIVER EDUCATION  Educated on exercise program? NO  Can successfully demonstrate exercise form? NO  Can demonstrate safe/effective assistance with functional mobility? YES    IMPAIRMENT BASED CRITERIA  Can the patient verbalize THA precautions including no leg crossing, no bending > 90 degrees and no twisting? YES  Does the patient have adequate pain control of <4/10 for functional mobility at home? NO    FUNCTIONAL BASED CRITERIA  Does that patient demonstrate the ability to ambulate 80 feet with RW using CGA/SPV assistance? YES  Does the patient demonstrate the ability to perform bed mobility with no more than min assist? YES  Does the patient demonstrated the ability to sit to stand from the bed or chair with min assist? YES  Does the patient demonstrate the ability to walk up/down 3-5 stairs with min assist as required for home entry? NO             INTERVENTION MINUTES: EVALUATION 15 minutes, THERAPEUTIC ACTIVITY 15 minutes, and GAIT TRAINING 23 MINUTES    EVALUATION COMPLEXITY : CLINICAL DECISION MAKING OF LOW COMPLEXITY AS INDICATED BY PMH, PHYSICAL THERAPY ASSESSMENT OF MUSCULOSKELETAL AND NEUROLOGICAL SYSTEMS AND ACTIVITY LIMITATIONS. CLINICAL PRESENTATION IS STABLE AND UNCOMPLICATED    Therapist:     Rollene Rotunda, PT  05/02/2023, 18:48

## 2023-05-02 NOTE — Care Plan (Signed)
Problem: Adult Inpatient Plan of Care  Goal: Plan of Care Review  Outcome: Ongoing (see interventions/notes)  Goal: Patient-Specific Goal (Individualized)  Outcome: Ongoing (see interventions/notes)  Flowsheets (Taken 05/02/2023 1944)  Individualized Care Needs: assist with ADL's, monitor  vital signs, monitor surgical dressing for bleeding and drainage, ambulation promoted, participation with physical therapy promoted  Anxieties, Fears or Concerns: pain,mobility  Patient-Specific Goals (Include Timeframe): discharge when criteria met  Plan of Care Reviewed With: patient  Goal: Absence of Hospital-Acquired Illness or Injury  Outcome: Ongoing (see interventions/notes)  Intervention: Identify and Manage Fall Risk  Recent Flowsheet Documentation  Taken 05/02/2023 1944 by Ross Marcus, RN  Safety Promotion/Fall Prevention:   activity supervised   fall prevention program maintained   nonskid shoes/slippers when out of bed   safety round/check completed  Intervention: Prevent Skin Injury  Recent Flowsheet Documentation  Taken 05/02/2023 1944 by Ross Marcus, RN  Body Position: supine, head elevated  Intervention: Prevent and Manage VTE (Venous Thromboembolism) Risk  Recent Flowsheet Documentation  Taken 05/02/2023 1944 by Ross Marcus, RN  VTE Prevention/Management: foot pump device on  Goal: Optimal Comfort and Wellbeing  Outcome: Ongoing (see interventions/notes)  Intervention: Provide Person-Centered Care  Recent Flowsheet Documentation  Taken 05/02/2023 1944 by Ross Marcus, RN  Trust Relationship/Rapport:   care explained   choices provided   emotional support provided  Goal: Rounds/Family Conference  Outcome: Ongoing (see interventions/notes)     Problem: Fall Injury Risk  Goal: Absence of Fall and Fall-Related Injury  Outcome: Ongoing (see interventions/notes)  Intervention: Promote Injury-Free Environment  Recent Flowsheet Documentation  Taken 05/02/2023 1944 by Ross Marcus, RN  Safety Promotion/Fall Prevention:   activity supervised    fall prevention program maintained   nonskid shoes/slippers when out of bed   safety round/check completed     Problem: Hip Arthroplasty  Goal: Optimal Coping  Outcome: Ongoing (see interventions/notes)  Goal: Absence of Bleeding  Outcome: Ongoing (see interventions/notes)  Goal: Effective Bowel Elimination  Outcome: Ongoing (see interventions/notes)  Goal: Fluid and Electrolyte Balance  Outcome: Ongoing (see interventions/notes)  Goal: Optimal Functional Ability  Outcome: Ongoing (see interventions/notes)  Goal: Absence of Infection Signs and Symptoms  Outcome: Ongoing (see interventions/notes)  Goal: Intact Neurovascular Status  Outcome: Ongoing (see interventions/notes)  Goal: Anesthesia/Sedation Recovery  Outcome: Ongoing (see interventions/notes)  Intervention: Optimize Anesthesia Recovery  Recent Flowsheet Documentation  Taken 05/02/2023 1944 by Ross Marcus, RN  Safety Promotion/Fall Prevention:   activity supervised   fall prevention program maintained   nonskid shoes/slippers when out of bed   safety round/check completed  Goal: Acceptable Pain Control  Outcome: Ongoing (see interventions/notes)  Goal: Nausea and Vomiting Relief  Outcome: Ongoing (see interventions/notes)  Goal: Effective Urinary Elimination  Outcome: Ongoing (see interventions/notes)  Goal: Effective Oxygenation and Ventilation  Outcome: Ongoing (see interventions/notes)  Intervention: Optimize Oxygenation and Ventilation  Recent Flowsheet Documentation  Taken 05/02/2023 1944 by Ross Marcus, RN  Head of Bed (HOB) Positioning: HOB at 45 degrees     Problem: Pain Acute  Goal: Optimal Pain Control and Function  Outcome: Ongoing (see interventions/notes)

## 2023-05-02 NOTE — Anesthesia Preprocedure Evaluation (Signed)
ANESTHESIA PRE-OP EVALUATION  Planned Procedure: MAKO ROBOTIC ASSISTED RIGHT TOTAL HIP ARTHROPLASTY USING ACCOLADE IMPLANTS (Right: Hip)  Review of Systems     anesthesia history negative               Pulmonary  negative pulmonary ROS,    Cardiovascular    Hypertension, ECG reviewed, ACE / ARB inhibitor use, ACE inhibitor taken in the last 24 hours and hyperlipidemia , Exercise Tolerance: > or = 4 METS        GI/Hepatic/Renal   negative GI/hepatic/renal ROS,         Endo/Other    hypothyroidism,      Neuro/Psych/MS    back abnormality (chronic low back pain)     Cancer                        Physical Assessment      Airway       Mallampati: II                  Dental                    Pulmonary    Breath sounds clear to auscultation       Cardiovascular    Rhythm: regular  Rate: Normal       Other findings              Plan  ASA 2     Planned anesthesia type: spinal                             Anesthesia issues/risks discussed are: PONV, Nerve Injuries, Cardiac Events/MI, Local Anesthetic Systemic Toxicity, Failure of Block, Spinal Headache, High Neuraxial Block, Dental Injuries and Stroke.  Anesthetic plan and risks discussed with patient  signed consent obtained          Patient's NPO status is appropriate for Anesthesia.

## 2023-05-02 NOTE — Care Management Notes (Signed)
[facility]  Care Management Initial Evaluation    Patient Name: Angela Benson  Date of Birth: 1946-07-27  Sex: female  Date/Time of Admission: 05/02/2023  5:36 AM  Room/Bed: 375/A  Payor: [id number] ADVANTAGE / Plan: [id number] MED[employer] ADVANTAGE PPO / Product Type: PPO /   Primary Care Providers:  Darlyne Russian, DO (General)    Pharmacy Info:   Preferred Pharmacy       [facility] - [place], [place] - [address]    [address] [place] [address]    Phone: [phone] Fax: [phone]    Hours: Not open 24 hours          Emergency Contact Info:   Extended Emergency Contact Information  Primary Emergency Contact: TOPPING,ROGER  Mobile Phone: [phone]  Relation: Husband  Preferred language: English  Interpreter needed? No    History:   Angela Benson is a 77 y.o., female, admitted 05/02/23.    Height/Weight: 167.6 cm (5\' 6" ) / 46.3 kg (102 lb)     LOS: 0 days   Admitting Diagnosis: Status post THR (total hip replacement) [Z96.649]    Assessment:      05/02/23 1600   Assessment Details   Assessment Type Admission   Date of Care Management Update 05/02/23   Readmission   Is this a readmission? No   Insurance Information/Type   Insurance type Medicare   Employment/Financial   Patient has Prescription Coverage?  Yes        Name of Insurance Coverage for Medications [employer]   Financial Concerns none   Living Environment   Select an age group to open "lives with" row.  Adult   Lives With spouse   Living Arrangements house   Able to Return to Prior Arrangements yes   IEP and/or 504 Plan? No   Home Safety   Home Assessment: No Problems Identified   Home Accessibility no concerns   Custody and Legal Status   Do you have a court appointed guardian/conservator? No   Are you an emancipated minor? No   Custody Issues? No   Paternity Affidavit Requested? No   Care Management Plan   Discharge Planning Status initial meeting   Projected Discharge Date 05/03/23    Discharge plan discussed with: Patient;Spouse   CM will evaluate for rehabilitation potential no   Discharge Needs Assessment   Equipment Currently Used at Home commode;walker, front wheeled   Equipment Needed After Discharge none   Discharge Facility/Level of Care Needs Home (Patient/Family Member/other)(code 1)   Transportation Available car;family or friend will provide   Referral Information   Admission Type observation   Arrived From home or self-care     Pt admitted with total right hip arthroplasty. CM met with pt at bedside. Pt was alert and oriented to all spheres. Pt lives with her husband and plans to return there upon discharge. Pt has 3 steps to get into their home and then it is all one level. Pt will have her husband, Angela Benson, to assist in her care. Pt has a walker and a bedside commode. Pt will not need to have outpatient physical therapy. Pt denies any unmet needs.     Discharge Plan:  Home (Patient/Family Member/other) (code 1)      The patient will continue to be evaluated for developing discharge needs.     Case Manager: Yaakov Guthrie, [place]  Phone: [phone]

## 2023-05-02 NOTE — OR Surgeon (Signed)
[place] [facility]  Operative Note     PATIENT NAME:  Angela Benson, Angela Benson  MRN:  [id number]  DOB:  [birth year]    Date of Procedure:  05/02/2023  Preoperative Diagnosis: OSTEOARTHRITIS RIGHT HIP   Postoperative Diagnoses:  OSTEOARTHRITIS RIGHT HIP   Procedure Performed: Procedure(s) (LRB):  MAKO ROBOTIC ASSISTED RIGHT TOTAL HIP ARTHROPLASTY USING PRESSFIT ACCOLADE II  STEM, TRIDENT II SHEEL, X3 POLY INSERT, AND BIOLOX CERAMIC HEAD  IMPLANTS (Right)   Implants:  Implant Name Type Inv. Item Serial No. Manufacturer Lot No. LRB No. Used Action   SHELL ACET HIP SLD BCK TRIT TRIDENT II D STRL - [id number]  SHELL ACET HIP SLD BCK TRIT TRIDENT II D STRL  [employer] [id number] Right 1 Implanted   INSERT ACET TRIDENT 0D D X3 HIP STRL LF - [id number]  INSERT ACET TRIDENT 0D D X3 HIP STRL LF  [employer] [id number] Right 1 Implanted   PLUG DOME H TRIDENT II HEX STRL LF - [id number]  PLUG DOME H TRIDENT II HEX STRL LF  [employer] [id number] Right 1 Implanted   STEM FEM ACCOLADE V40 PRFT PUREFIX TI HIP 127D 5 HI OFST STRL - [id number]  STEM FEM ACCOLADE V40 PRFT PUREFIX TI HIP 127D 5 HI OFST STRL  [employer] [id number] A Right 1 Implanted   HEAD V40 +7.5MM OFST TAPER HIP Arnetha Gula FEM STRL - [id number]  HEAD V40 +7.5MM OFST TAPER HIP Arnetha Gula FEM STRL  [employer] [id number] Right 1 Implanted      Surgeon: Alexia Freestone, MD   Anesthesia: Anesthesiologist: Nydia Bouton, DO  CRNA: Fannie Knee, CRNA spinal  OR Staff: Circulator: Octavio Graves, RN  PERIOPERATIVE CARE ASSISTANT: Selinda Eon, PCA  Scrub Person: Aquilla Solian, ST  Scrub First Assist: Almyra Free, ST  Scrub Retractor Assist: Stephan Minister, RN   Antibiotic:  Vancomycin + Kefzol  Anticoagulation: ASA   Estimated Blood Loss: * No blood loss amount entered *    Specimens: * No specimens in log *   Complications: None immediate  Indications For Procedure:  Angela Benson   is a very pleasant  77 y.o. female  presenting  for OSTEOARTHRITIS RIGHT HIP   Risks of total hip including sciatic nerve injury, dislocation, infection, DVT, PE, intra-operative fracture, and potential medical complications and implications discussed. Technical sales engineer and alternatives reviewed. Surgical informed consent obtained.  DESCRIPTION OF THE PROCEDURE:  Pre-op digital planning completed on the CT dataset.  Sizing and position of implants, leg length, and offset determined and reviewed.  Side and site were identified with consent and marking on patient.  Antibiotics and TXA were administered prior to incision.  Prep and draping were performed in the lateral decubitus position.  An EKG marker was secured to the patella.  Pelvic array positioning: After sterile preparation and draping, 1 pin was placed in the iliac crest 3 fingerbreadths posterior to the anterior superior iliac spine.  The pin guide was then applied and the second and third pin placed in appropriate orientation.  The anchor for the pelvic array was then assembled and the final third pin inserted.  The pelvic array was aligned and tightened.  Posterolateral approach to hip was completed.  Short external rotators were divided and retracted to protect the sciatic nerve.  Prior to dislocation of the hip, calibration button was placed in the greater trochanter and calibration for leg length performed with leg in a  known position, checking both the knee and the trochanteric marker.  The hip was dislocated, and the femoral neck was marked using the probe at the osteotomy site determined in the robotic plan.  Femur osteotomized at appropriate length and orientation.  The acetabulum was exposed, and calibration performed.  Once satisfactory calibration was verified, the robotic arm was used to perform reaming to appropriate depth.  Acetabular position and reaming were verified using the freehand probe.  The acetabulum was impacted in orientation  designated on the preop CT plan.  Depth of seating was verified with the robot and was verified through the central hole.  The hole was capped with the screw in plug.  The polyethylene liner was seated.  Orientation and fixation of the shell and poly was verified.  The femoral side was exposed.  The trochanter was opened with trochanteric gouge.  The femur was reamed and broached to the above noted size.  Trial reduction was performed and found to be stable through fully obtainable range.  Trial reduction was performed, and leg lengths evaluated and found to make leg 5 long with +5 offset compared to pre op plan of +2, + 4 offset. Cup orientation verified to be correct with robot. Various head, neck comginations tested.  Above components gave excellent stability..  Hip was put through full range of motion with excellent stability.  The hip was dislocated and provisionals were removed.  The final components were seated.  The femoral head was reduced on to a dry trunnion, and the hip was reduced.  Final testing revealed excellent stability and appropriate soft tissue tension.  Leg length was rechecked with the robot and found to correspond with provisional reduction..  Measurement buttons were removed.  The short external rotators were reattached to their trochanteric insertions using #5 ethibond suture.  The wound was closed in layers finishing with subcuticular stitch.  Patient was taken to recovery under the supervision of anesthesia.  Alexia Freestone, MD  This note was partially generated using MModal Fluency Direct system, and there may be some incorrect words, spellings, and punctuation that were not noted in checking the note before saving.

## 2023-05-03 ENCOUNTER — Emergency Department (EMERGENCY_DEPARTMENT_HOSPITAL)
Admission: EM | Admit: 2023-05-03 | Discharge: 2023-05-04 | Disposition: A | Discharge status: Home / Self Care | Payer: Medicare PPO | Source: Home / Self Care | Attending: Emergency Medicine | Admitting: Emergency Medicine

## 2023-05-03 ENCOUNTER — Other Ambulatory Visit: Payer: Self-pay

## 2023-05-03 DIAGNOSIS — G8918 Other acute postprocedural pain: Secondary | ICD-10-CM | POA: Insufficient documentation

## 2023-05-03 DIAGNOSIS — Z96641 Presence of right artificial hip joint: Secondary | ICD-10-CM | POA: Insufficient documentation

## 2023-05-03 LAB — CBC WITH DIFF
BASOPHIL #: 0 10*3/uL (ref 0.00–0.10)
BASOPHIL %: 0 % (ref 0–1)
EOSINOPHIL #: 0 10*3/uL (ref 0.00–0.50)
EOSINOPHIL %: 0 % — ABNORMAL LOW (ref 1–7)
HCT: 35.8 % (ref 31.2–41.9)
HGB: 11.8 g/dL (ref 10.9–14.3)
LYMPHOCYTE #: 1.5 10*3/uL (ref 1.00–3.00)
LYMPHOCYTE %: 11 % — ABNORMAL LOW (ref 16–44)
MCH: 31.5 pg (ref 24.7–32.8)
MCHC: 33.1 g/dL (ref 32.3–35.6)
MCV: 95.3 fL (ref 75.5–95.3)
MONOCYTE #: 1.1 10*3/uL — ABNORMAL HIGH (ref 0.30–1.00)
MONOCYTE %: 8 % (ref 5–13)
MPV: 8.1 fL (ref 7.9–10.8)
NEUTROPHIL #: 11.5 10*3/uL — ABNORMAL HIGH (ref 1.85–7.80)
NEUTROPHIL %: 82 % — ABNORMAL HIGH (ref 43–77)
PLATELETS: 225 10*3/uL (ref 140–440)
RBC: 3.76 10*6/uL (ref 3.63–4.92)
RDW: 13.5 % (ref 12.3–17.7)
WBC: 14.1 10*3/uL — ABNORMAL HIGH (ref 3.8–11.8)

## 2023-05-03 LAB — BASIC METABOLIC PANEL
ANION GAP: 5 mmol/L (ref 4–13)
BUN/CREA RATIO: 23 — ABNORMAL HIGH (ref 6–22)
BUN: 16 mg/dL (ref 7–25)
CALCIUM: 8.9 mg/dL (ref 8.6–10.3)
CHLORIDE: 104 mmol/L (ref 98–107)
CO2 TOTAL: 25 mmol/L (ref 21–31)
CREATININE: 0.7 mg/dL (ref 0.60–1.30)
ESTIMATED GFR: 90 mL/min/{1.73_m2} (ref >59)
GLUCOSE: 151 mg/dL — ABNORMAL HIGH (ref 74–109)
OSMOLALITY, CALCULATED: 272 mosm/kg (ref 270–290)
POTASSIUM: 4.5 mmol/L (ref 3.5–5.1)
SODIUM: 134 mmol/L — ABNORMAL LOW (ref 136–145)

## 2023-05-03 NOTE — Nurses Notes (Signed)
Discharge orders received, instructions given to patient. Nozin and instructions given to patient. IV removed with catheter intact. Patient A&O at time of discharge. Dressing to hip remains C,D,I. Patient assisted off unit at this time by nursing staff.

## 2023-05-03 NOTE — Discharge Instructions (Signed)
Orthopaedic Discharge Instructions:    Weightbear as tolerated to the operative extremity with use of an assistive device as needed upon ambulation    Follow hip dislocation precautions for the next 6 weeks    Maintain surgical dressing until postoperative day 7.  May remove dressing at this time and leave incision open to air if dry.  If there is persistent drainage please cover with a gauze dressing and change daily. Please call the clinic with any erythema or purulent drainage concerning for infection.    May shower, no tub baths until follow-up     Take pain medication and blood thinners as prescribed    Follow-up in the orthopedics clinic at your scheduled appointment in approximately 2-3 weeks.  Please call the office to confirm your appointment.  Please also call with any questions or concerns.      [facility]  [address], [place], [place] [address]  [phone]

## 2023-05-03 NOTE — Discharge Summary (Signed)
ORTHOPAEDIC SURGERY DISCHARGE SUMMARY    PATIENT NAME:  Angela Benson  MRN:  [id number]  DOB:  March 28, 1946    ADMISSION DATE:  05/02/2023  DISCHARGE DATE:  05/03/2023     ATTENDING PHYSICIAN: Angela Craver Danny Yackley, PA-C  PRIMARY CARE PHYSICIAN: Angela Farber, DO      ADMISSION DIAGNOSIS:     Status post THR (total hip replacement) [Z96.649]    DISCHARGE DIAGNOSIS:      Status Post right total hip replacement    DISCHARGE MEDICATIONS:       Current Discharge Medication List        START taking these medications.        Details   aspirin 81 mg Tablet, Delayed Release (E.C.)  Commonly known as: ECOTRIN   81 mg, Oral, 2 TIMES DAILY, X 6 weeks  Refills: 0            CONTINUE these medications - NO CHANGES were made during your visit.        Details   Calcium Magnesium 500 mg calcium- 250 mg Tablet  Generic drug: calcium carb,gluc-mag gluc,ox   1 Tablet, Oral, DAILY  Refills: 0     cholecalciferol (vitamin D3) 50 mcg (2,000 unit) Tablet   2,000 Units, Oral, DAILY  Refills: 0     clotrimazole-betamethasone 1-0.05 % Cream  Commonly known as: LOTRISONE   Topical, 2 TIMES DAILY  Qty: 45 g  Refills: 4     coenzyme Q10 100 mg Capsule   100 mg, Oral, DAILY  Refills: 0     cyanocobalamin 1,000 mcg Tablet  Commonly known as: VITAMIN B 12   1,000 mcg, Oral, DAILY  Refills: 0     levothyroxine 50 mcg Tablet  Commonly known as: SYNTHROID   50 mcg, Oral, DAILY  Qty: 90 Tablet  Refills: 2     lisinopriL 20 mg Tablet  Commonly known as: PRINIVIL   20 mg, Oral, DAILY  Qty: 90 Tablet  Refills: 0              DISCHARGE INSTRUCTIONS:     Weight bear as tolerated to the operative extremity with use of an assistive device as needed upon ambulation    Maintain surgical dressing until postoperative day 7.  May remove dressing at this time and leave incision open to air if dry.  If there is persistent drainage please cover with a gauze dressing and change daily. Please call the clinic with any erythema or purulent drainage concerning for  infection.    May shower, no tub baths until follow-up     Take pain medication and blood thinners as prescribed. Attend physical therapy as instructed.    Follow-up in the orthopedics clinic at your scheduled appointment in approximately 2-3 weeks.  Please call the office to confirm your appointment.  Please also call with any questions or concerns.    REASON FOR HOSPITALIZATION AND HOSPITAL COURSE:      Angela Benson is a 77 y.o.female that I have been following as an outpatient secondary to osteo arthritis.   she went on to fail conservative treatment of their arthritic joint and elected to proceed with a total joint replacement.  she gained preop medical clearance.  We instructed him on the risk and benefits of the surgery, reviewed the consent in its entirety, we reviewed the hospital course including interoperative and postoperative goals.  she arrived on date of procedure.  her  interoperative procedure was uncomplicated.  Patient was  admitted postoperatively and remained stable through postoperative course.  Placed on appropriate DVT prophylaxis. They progressed well with physical therapy and will be discharged to home.  Patient will need a walker for initial ambulation to assist with correcting gait form and function.  We have educated the patient on the discharge process,  reviewed med reconciliation, and once again reviewed postoperative goals.  Patient was educated on dressing instructions and care.  We have reminded the patient through education the importance of a healthy diet during the perioperative period.  Questions were encouraged and answered.        RADIOLOGY:           CONSULTATIONS:      None     PROCEDURES PERFORMED:      Right Total hip Replacement    CONDITION ON DISCHARGE:     Stable    DISCHARGE DISPOSITION:      Home    FOLLOW UP VISITS:    - Angela Benson in 2-3 weeks at scheduled appointment; call [facility] clinic to confirm or with questions/concerns.  - It is recommended you follow up  with your PCP within 2 weeks to review any medical changes made during this hospitalization.    Angela Mola, PA-C  05/03/23 10:42  [facility]  Please call with any questions/concerns     This note has been created with voice recognition software.  Please excuse any errors in transcription.  Occasional wrong word or sound alike substitutions may have occurred due to the inherent limitations of voice recognition software.  Please read the chart carefully and recognize using context with the substitutions may have occurred.

## 2023-05-03 NOTE — Care Plan (Signed)
Problem: Adult Inpatient Plan of Care  Goal: Plan of Care Review  05/03/2023 0137 by Amalia Greenhouse, RN  Outcome: Ongoing (see interventions/notes)  05/03/2023 0136 by Amalia Greenhouse, RN  Outcome: Ongoing (see interventions/notes)  Goal: Patient-Specific Goal (Individualized)  05/03/2023 0137 by Amalia Greenhouse, RN  Outcome: Ongoing (see interventions/notes)  05/03/2023 0136 by Amalia Greenhouse, RN  Outcome: Ongoing (see interventions/notes)  Goal: Absence of Hospital-Acquired Illness or Injury  05/03/2023 0137 by Amalia Greenhouse, RN  Outcome: Ongoing (see interventions/notes)  05/03/2023 0136 by Amalia Greenhouse, RN  Outcome: Ongoing (see interventions/notes)  Goal: Optimal Comfort and Wellbeing  05/03/2023 0137 by Amalia Greenhouse, RN  Outcome: Ongoing (see interventions/notes)  05/03/2023 0136 by Amalia Greenhouse, RN  Outcome: Ongoing (see interventions/notes)  Goal: Rounds/Family Conference  05/03/2023 0137 by Amalia Greenhouse, RN  Outcome: Ongoing (see interventions/notes)  05/03/2023 0136 by Amalia Greenhouse, RN  Outcome: Ongoing (see interventions/notes)     Problem: Fall Injury Risk  Goal: Absence of Fall and Fall-Related Injury  05/03/2023 0137 by Amalia Greenhouse, RN  Outcome: Ongoing (see interventions/notes)  05/03/2023 0136 by Amalia Greenhouse, RN  Outcome: Ongoing (see interventions/notes)     Problem: Hip Arthroplasty  Goal: Optimal Coping  05/03/2023 0137 by Amalia Greenhouse, RN  Outcome: Ongoing (see interventions/notes)  05/03/2023 0136 by Amalia Greenhouse, RN  Outcome: Ongoing (see interventions/notes)  Goal: Absence of Bleeding  05/03/2023 0137 by Amalia Greenhouse, RN  Outcome: Ongoing (see interventions/notes)  05/03/2023 0136 by Amalia Greenhouse, RN  Outcome: Ongoing (see interventions/notes)  Goal: Effective Bowel Elimination  05/03/2023 0137 by Amalia Greenhouse, RN  Outcome: Ongoing (see interventions/notes)  05/03/2023 0136 by Amalia Greenhouse, RN  Outcome: Ongoing (see interventions/notes)  Goal: Fluid and Electrolyte Balance  05/03/2023 0137 by Amalia Greenhouse, RN  Outcome: Ongoing (see interventions/notes)  05/03/2023 0136 by Amalia Greenhouse, RN  Outcome: Ongoing (see  interventions/notes)  Goal: Optimal Functional Ability  05/03/2023 0137 by Amalia Greenhouse, RN  Outcome: Ongoing (see interventions/notes)  05/03/2023 0136 by Amalia Greenhouse, RN  Outcome: Ongoing (see interventions/notes)  Goal: Absence of Infection Signs and Symptoms  05/03/2023 0137 by Amalia Greenhouse, RN  Outcome: Ongoing (see interventions/notes)  05/03/2023 0136 by Amalia Greenhouse, RN  Outcome: Ongoing (see interventions/notes)  Goal: Intact Neurovascular Status  05/03/2023 0137 by Amalia Greenhouse, RN  Outcome: Ongoing (see interventions/notes)  05/03/2023 0136 by Amalia Greenhouse, RN  Outcome: Ongoing (see interventions/notes)  Goal: Anesthesia/Sedation Recovery  05/03/2023 0137 by Amalia Greenhouse, RN  Outcome: Ongoing (see interventions/notes)  05/03/2023 0136 by Amalia Greenhouse, RN  Outcome: Ongoing (see interventions/notes)  Goal: Acceptable Pain Control  05/03/2023 0137 by Amalia Greenhouse, RN  Outcome: Ongoing (see interventions/notes)  05/03/2023 0136 by Amalia Greenhouse, RN  Outcome: Ongoing (see interventions/notes)  Goal: Nausea and Vomiting Relief  05/03/2023 0137 by Amalia Greenhouse, RN  Outcome: Ongoing (see interventions/notes)  05/03/2023 0136 by Amalia Greenhouse, RN  Outcome: Ongoing (see interventions/notes)  Goal: Effective Urinary Elimination  05/03/2023 0137 by Amalia Greenhouse, RN  Outcome: Ongoing (see interventions/notes)  05/03/2023 0136 by Amalia Greenhouse, RN  Outcome: Ongoing (see interventions/notes)  Goal: Effective Oxygenation and Ventilation  05/03/2023 0137 by Amalia Greenhouse, RN  Outcome: Ongoing (see interventions/notes)  05/03/2023 0136 by Amalia Greenhouse, RN  Outcome: Ongoing (see interventions/notes)     Problem: Pain Acute  Goal: Optimal Pain Control and Function  05/03/2023 0137 by Amalia Greenhouse, RN  Outcome: Ongoing (see interventions/notes)  05/03/2023 0136 by Amalia Greenhouse, RN  Outcome: Ongoing (see interventions/notes)   Care plan ongoing. Patient medicated  for pain as scheduled and PRN. IV fluids infusing. Foot pumps in use. Patient ambulated with staff. Fall precautions in place. Hip precautions in place. Discharge planning ongoing.

## 2023-05-03 NOTE — Nurses Notes (Signed)
Prescriptions for Zofran 4mg  Q6 hours prn, 15 tabs NR  and tramadol 50 mg q 6 hrs 42 tabs no refills to [facility]

## 2023-05-03 NOTE — ED Triage Notes (Signed)
EMS called for right hip pain after hip replacement yesterday. Rates pain 10/10. BS 90.

## 2023-05-03 NOTE — PT Treatment (Signed)
[facility]  [address]  [place], [address]  [phone]  (Fax) [phone]  Rehabilitation Department  Physical Therapy Daily Inpatient THA Note    Date: 05/03/2023  Patient's Name: Angela Benson  Date of Birth: 09-09-1945  Height: Height: 167.6 cm (5\' 6" )  Weight: Weight: 46.3 kg (102 lb)      Plan: Will continue under current POC.         Subjective/Objective/Assessment:  Flowsheet    05/03/23 0835   Rehab Session   Document Type therapy progress note (daily note)   PT Visit Date 05/03/23   General Information   Patient Profile Reviewed yes   Respiratory Status room air   Pre Treatment Status   Pre Treatment Patient Status Patient sitting in bedside chair or w/c;Call light within reach;Telephone within reach;Patient safety alarm activated   Support Present Pre Treatment  None   Communication Pre Treatment  Nurse   Communication Pre Treatment Comment cleared for PT   Pre- Treatment Vital Signs   Vitals Comment not being monitored   Pre-Treatment Pain   Pretreatment Pain Rating 8/10   Transfer Assessment/Treatment   Sit-Stand Independence verbal cues required;contact guard assist   Stand-Sit Independence contact guard assist;verbal cues required   Sit-Stand-Sit, Assist Device walker, front wheeled   Gait Assessment/Treatment   Total Distance Ambulated 360   Independence  contact guard assist;verbal cues required   Assistive Device  walker, front wheeled   Distance in Feet 360   Impairments  balance impaired;pain;ROM decreased   Stairs Assessment/Treatment   Number of Stairs 4   Handrail Location both sides   Independence Level contact guard assist;verbal cues required   Technique Used step to step (ascending);step to step (descending)   Safety Issues weight-shifting ability decreased   Impairments balance impaired;pain;ROM decreased   Balance   Sitting, Dynamic (Balance) good balance   Standing Balance: Static good balance   Standing Balance: Dynamic fair + balance    Post Treatment Status   Post Treatment Patient Status Patient sitting in bedside chair or w/c;Call light within reach;Telephone within reach;Patient safety alarm activated   Support Present Post Treatment  None   Patient Effort excellent   Cognitive Assessment/Intervention   Behavior/Mood Observations behavior appropriate to situation, WNL/WFL   Physical Therapy Time and Intention   Total PT Minutes: 18               Physical Therapy Inpatient THA D/C Criteria        PATIENT/CAREGIVER EDUCATION  Educated on exercise program? YES  Can successfully demonstrate exercise form? YES  Can demonstrate safe/effective assistance with functional mobility? YES    IMPAIRMENT BASED CRITERIA  Can the patient verbalize THA precautions including no leg crossing, no bending > 90 degrees and no twisting? YES  Does the patient have adequate pain control of <4/10 for functional mobility at home? NO and COMMENT Patient is 1 day post THA    FUNCTIONAL BASED CRITERIA  Does that patient demonstrate the ability to ambulate 80 feet with RW using CGA/SPV assistance? YES  Does the patient demonstrate the ability to perform bed mobility with no more than min assist? YES  Does the patient demonstrated the ability to sit to stand from the bed or chair with min assist? YES  Does the patient demonstrate the ability to walk up/down 3-5 stairs with min assist as required for home entry? YES        THERAPIST  Tarri Abernethy, PTA  05/03/2023, 09:59         Intervention minutes: THERAPEUTIC EXERCISE 34 minutes and GAIT TRAINING [address] MINUTES    THERAPIST  Tarri Abernethy, PTA  05/03/2023, 09:59

## 2023-05-04 ENCOUNTER — Emergency Department (HOSPITAL_COMMUNITY): Payer: Medicare PPO

## 2023-05-04 ENCOUNTER — Encounter (HOSPITAL_COMMUNITY): Payer: Self-pay | Admitting: Emergency Medicine

## 2023-05-04 MED ORDER — ONDANSETRON 4 MG DISINTEGRATING TABLET
4.0000 mg | ORAL_TABLET | Freq: Three times a day (TID) | ORAL | 0 refills | Status: AC | PRN
Start: 2023-05-04 — End: ?

## 2023-05-04 MED ORDER — HYDROCODONE 5 MG-ACETAMINOPHEN 325 MG TABLET
1.0000 | ORAL_TABLET | ORAL | Status: AC
Start: 2023-05-04 — End: 2023-05-04
  Administered 2023-05-04: 1 via ORAL

## 2023-05-04 MED ORDER — HYDROCODONE 5 MG-ACETAMINOPHEN 325 MG TABLET
1.0000 | ORAL_TABLET | Freq: Four times a day (QID) | ORAL | 0 refills | Status: DC | PRN
Start: 2023-05-04 — End: 2023-06-18

## 2023-05-04 MED ORDER — ONDANSETRON 4 MG DISINTEGRATING TABLET
4.0000 mg | ORAL_TABLET | ORAL | Status: AC
Start: 2023-05-04 — End: 2023-05-04
  Administered 2023-05-04: 4 mg via ORAL

## 2023-05-04 MED ORDER — HYDROCODONE 5 MG-ACETAMINOPHEN 325 MG TABLET
ORAL_TABLET | ORAL | Status: AC
Start: 2023-05-04 — End: 2023-05-04
  Filled 2023-05-04: qty 2

## 2023-05-04 MED ORDER — HYDROCODONE 5 MG-ACETAMINOPHEN 325 MG TABLET
ORAL_TABLET | ORAL | Status: AC
Start: 2023-05-04 — End: 2023-05-04
  Filled 2023-05-04: qty 1

## 2023-05-04 MED ORDER — ONDANSETRON 4 MG DISINTEGRATING TABLET
ORAL_TABLET | ORAL | Status: AC
Start: 2023-05-04 — End: 2023-05-04
  Filled 2023-05-04: qty 1

## 2023-05-04 MED ORDER — HYDROCODONE 5 MG-ACETAMINOPHEN 325 MG TABLET
2.0000 | ORAL_TABLET | ORAL | Status: AC
Start: 2023-05-04 — End: 2023-05-04
  Administered 2023-05-04: 2 via ORAL

## 2023-05-04 NOTE — ED Nurses Note (Signed)
Patient discharged home with family.  AVS reviewed with patient/care giver.  A written copy of the AVS and discharge instructions was given to the patient/care giver.  Questions sufficiently answered as needed.  Patient/care giver encouraged to follow up with PCP as indicated.  In the event of an emergency, patient/care giver instructed to call 911 or go to the nearest emergency room.

## 2023-05-04 NOTE — ED Provider Notes (Signed)
[place] [facility]  ED Primary Provider Note        Arrival: The patient arrived by Ambulance     History of Present Illness   chief complaint  Angela Benson is a 77 y.o. female who had concerns including Hip Pain.  Patient had right total hip replacement on October 4th; she was discharged home on October 5th.  Patient presents emergency room for pain in the right hip region.  She was rates it as 8/10.  Patient is discharged home on tramadol; this was by her choice ortho wanted to discharge her home on Lortab.  Patient states it was painful to ambulate.  Patient did arrive by EMS.  She was very pleasant and cooperative.  Good sensation over all dermatomes.  All nursing notes reviewed        Review of Systems     No other overt Review of Systems are noted to be positive except noted in the HPI.      Historical Data   History Reviewed This Encounter: Medical History  Surgical History  Family History  Social History      Physical Exam   ED Triage Vitals [05/03/23 2357]   BP (Non-Invasive) 128/78   Heart Rate 81   Respiratory Rate 18   Temperature 36.7 C (98.1 F)   SpO2 97 %   Weight 46.3 kg (102 lb)   Height 1.676 m (5\' 6" )         Exam:   Constitutional:  Patient alert orient x3 in no apparent distress.  No limitations.  Heart:  Regular rate and rhythm without audible murmur  Lungs:  Clear to auscultation bilaterally without any wheezing/rales/rhonchi  Abdomen:  Soft nontender without any rebound or guarding; positive bowel sounds throughout  Genitalia:  Deferred  Skin:  Well-healing surgical site right hip:  Otherwise  warm and dry without lesions.  Normal skin turgor.  Brisk capillary refill distally  Extremities:  Patient was tender to palpation over lateral aspect of the right hip.  Full range of motion but with discomfort.  Good sensation over all dermatomes.  Brisk capillary refill distally with the dorsalis pedis +2.    Neuro:  Alert oriented x3.  Cranial nerves II-XII grossly  intact as tested.  Excellent sensation distally over all dermatomes.            Procedures           Patient Data   Labs Ordered/Reviewed - No data to display    XR HIP RIGHT W PELVIS 2-3 VIEWS   Final Result by Edi, Radresults In (10/06 0050)   No evidence of constipation involving the recently placed right hip arthroplasty.                Radiologist location ID: [id number]             Medical Decision Making          Medical Decision Making  Two-view of right hip shows shows no fracture, no deformity.  Status post right total hip.  Review of the [place] shows no active controlled prescriptions.  Patient was given Lortab 5 here in emergency room she was had significant pain relief.  Patient be discharged home with a prescription for Lortab 5 12. One p.o. q.6 hours p.r.n. pain.  We will also give her 2 tablets to go.  See discharge instructions for detailed.  We will also provide the patient with prescription for  Zofran for nausea    Amount and/or Complexity of Data Reviewed  Radiology: ordered and independent interpretation performed. Decision-making details documented in ED Course.    Risk  Prescription drug management.    Critical Care  Total time providing critical care: 0 minutes        ED Course as of 05/04/23 0305   Sun May 04, 2023   0106 Two-view right ZOX:WRUEAVWU:   No acute fracture.  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  There is air in the soft tissues about the right hip compatible with recent postoperative status.        IMPRESSION:  No evidence of constipation involving the recently placed right hip arthroplasty.         0300 Review of the [place] shows no active controlled prescriptions.  Patient was given Lortab 5 here in emergency room she was had significant pain relief.  Patient be discharged home with a prescription for Lortab 5 12. One p.o. q.6 hours p.r.n. pain.  We will also give her 2 tablets to go.  See discharge instructions for  detailed.  We will also provide the patient with prescription for Zofran for nausea         Medications Administered in the ED   HYDROcodone-acetaminophen (NORCO) 5-325 mg per tablet (has no administration in time range)   HYDROcodone-acetaminophen (NORCO) 5-325 mg per tablet (1 Tablet Oral Given 05/04/23 0122)   ondansetron (ZOFRAN ODT) rapid dissolve tablet (4 mg Oral Given 05/04/23 0122)       Following the history, physical exam, and ED workup, the patient was deemed stable and suitable for discharge. The patient/caregiver was advised to return to the ED for any new or worsening symptoms. Discharge medications, and follow-up instructions were discussed with the patient/caregiver in detail, who verbalizes understanding. The patient/caregiver is in agreement and is comfortable with the plan of care.    Disposition: Discharged         Current Discharge Medication List        START taking these medications.        Details   HYDROcodone-acetaminophen 5-325 mg Tablet  Commonly known as: NORCO   1 Tablet, Oral, EVERY 6 HOURS PRN  Qty: 12 Tablet  Refills: 0     ondansetron 4 mg Tablet, Rapid Dissolve  Commonly known as: ZOFRAN ODT   4 mg, Oral, EVERY 8 HOURS PRN  Qty: 12 Tablet  Refills: 0            CONTINUE these medications - NO CHANGES were made during your visit.        Details   aspirin 81 mg Tablet, Delayed Release (E.C.)  Commonly known as: ECOTRIN   81 mg, Oral, 2 TIMES DAILY, X 6 weeks  Refills: 0     Calcium Magnesium 500 mg calcium- 250 mg Tablet  Generic drug: calcium carb,gluc-mag gluc,ox   1 Tablet, Oral, DAILY  Refills: 0     cholecalciferol (vitamin D3) 50 mcg (2,000 unit) Tablet   2,000 Units, Oral, DAILY  Refills: 0     clotrimazole-betamethasone 1-0.05 % Cream  Commonly known as: LOTRISONE   Topical, 2 TIMES DAILY  Qty: 45 g  Refills: 4     coenzyme Q10 100 mg Capsule   100 mg, Oral, DAILY  Refills: 0     cyanocobalamin 1,000 mcg Tablet  Commonly known as: VITAMIN B 12   1,000 mcg, Oral,  DAILY  Refills: 0  levothyroxine 50 mcg Tablet  Commonly known as: SYNTHROID   50 mcg, Oral, DAILY  Qty: 90 Tablet  Refills: 2     lisinopriL 20 mg Tablet  Commonly known as: PRINIVIL   20 mg, Oral, DAILY  Qty: 90 Tablet  Refills: 0            Follow up:   Alexia Freestone, MD  [address]  [place] [address]  [phone]    In 1 week                   Clinical Impression   Postoperative pain (Primary)         Current Discharge Medication List        START taking these medications    Details   HYDROcodone-acetaminophen (NORCO) 5-325 mg Oral Tablet Take 1 Tablet by mouth Every 6 hours as needed for Pain  Qty: 12 Tablet, Refills: 0      ondansetron (ZOFRAN ODT) 4 mg Oral Tablet, Rapid Dissolve Take 1 Tablet (4 mg total) by mouth Every 8 hours as needed for Nausea/Vomiting  Qty: 12 Tablet, Refills: 0             R.A. Santiago Bumpers, DO  Department of Emergency Medicine

## 2023-05-04 NOTE — Discharge Instructions (Signed)
Take Lortab as needed for discomfort  Take Zofran as needed for nausea  Follow up with surgeon for recheck this week  Return to emergency room for any increased pain, numbness/tingling, or any concerns

## 2023-05-05 ENCOUNTER — Telehealth (HOSPITAL_COMMUNITY): Payer: Self-pay | Admitting: Family Medicine

## 2023-05-05 NOTE — Telephone Encounter (Signed)
Post Ed Follow-Up    Post ED Follow-Up:   Document completed and/or attempted interactive contact(s) after transition to home after emergency department stay.:   Transition Facility and relevant Date:   Discharge Date: 05/04/23  Discharge from [facility] Emergency Department?: Yes  Discharge Facility: [facility]  Contacted by: Milinda Pointer RN  Contact method: Patient/Caregiver Telephone, MyChart Patient Portal  Contact first attempt: 05/05/2023  8:47 AM  MyChart message sent?: Yes  Medications prescribed: Yes  Hospital Follow up appointment already scheduled for 10/15. MyChart message sent, number invalid for telephone attempt.  Milinda Pointer, RN

## 2023-05-05 NOTE — ED Notes (Signed)
[place] [facility]  Peer Recovery Coach Assessment    Initial Evaluation                         Substance Use History  Patient current substance use status: FALSE POSITIVE DUE TO NARCOTIC PRESCRIPTIION BEING GIVEN AFTER HIP REPLACEMENT.                                  Family, Social, Home & Safety History                                                 Employment            Engagement  Readiness ruler: 1  Summary of assessment priority areas: comments: FALSE POSITIVE DUE TO NARCOTIC PRESCRIPTIION BEING GIVEN AFTER HIP REPLACEMENT.    Brief Intervention  Discussed plan to reduce/quit substance use?: No  Discussed willingness to enter treatment?: No  Indicated patient's stage of change:: 1 - Precontemplation    Patient seen by Peer Recovery Coach and is a candidate for buprenorphine administration in the ED. Patient needs assessment for bup treatment.: No    Plan  Was the patient referred to treatment?: No    Was patient referred to physician for Buprenorphine Assessment in the ED?: No    Did patient receive Narcan in the ED?: No    Plan: Additional Comments: FALSE POSITIVE DUE TO NARCOTIC PRESCRIPTIION BEING GIVEN AFTER HIP REPLACEMENT.    Follow-up           Need for additional follow-up?: No  Additional comments: FALSE POSITIVE DUE TO NARCOTIC PRESCRIPTIION BEING GIVEN AFTER HIP REPLACEMENT.    Taygan Connell, Peer Recovery Coach 05/05/2023 10:08

## 2023-05-06 ENCOUNTER — Telehealth (INDEPENDENT_AMBULATORY_CARE_PROVIDER_SITE_OTHER): Payer: Self-pay | Admitting: Family Medicine

## 2023-05-12 ENCOUNTER — Other Ambulatory Visit (INDEPENDENT_AMBULATORY_CARE_PROVIDER_SITE_OTHER): Payer: Self-pay | Admitting: Family Medicine

## 2023-05-13 ENCOUNTER — Ambulatory Visit (INDEPENDENT_AMBULATORY_CARE_PROVIDER_SITE_OTHER): Payer: Medicare PPO

## 2023-05-13 ENCOUNTER — Encounter (INDEPENDENT_AMBULATORY_CARE_PROVIDER_SITE_OTHER): Payer: Self-pay

## 2023-05-13 ENCOUNTER — Other Ambulatory Visit: Payer: Self-pay

## 2023-05-13 VITALS — BP 146/70 | HR 58 | Temp 97.8°F | Resp 18 | Ht 66.0 in | Wt 109.0 lb

## 2023-05-13 DIAGNOSIS — Z09 Encounter for follow-up examination after completed treatment for conditions other than malignant neoplasm: Secondary | ICD-10-CM

## 2023-05-13 DIAGNOSIS — Z96641 Presence of right artificial hip joint: Secondary | ICD-10-CM

## 2023-05-13 NOTE — Nursing Note (Signed)
05/13/23 1050   Domestic Violence   Because we are aware of abuse and domestic violence today, we ask all patients: Are you being hurt, hit, or frightened by anyone at your home or in your life?  N   Basic Needs   Do you have any basic needs within your home that are not being met? (such as Food, Shelter, [employer]er, Tranportation, paying for bills and/or medications) N   Advanced Directives   Do you have any advanced directives? Living Will & MPOA   Do you have the Advanced Directive(s) so we can scan them to your chart? N   Would you like an advanced directive packet? Refused Packet

## 2023-05-13 NOTE — Progress Notes (Signed)
FAMILY MEDICINE, MEDICAL OFFICE BUILDING  [address]  [place] [address]    Transitional Care Management Note    Name: Angela Benson MRN:  [id number]   Date: 05/13/2023 Age: 77 y.o.     Chief Complaint: Hospital Follow Up (Patient is here to follow up from recent surgery- right total hip arthroplasty, she reports post operative swelling has improved. ) and Hospital Discharge Transition    SUBJECTIVE:  Angela Benson is a 77 y.o. female presenting today for follow-up after being discharged. 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 Procedure performed 10/04. Patient recovered appropriately and was discharged to home with Tramadol 50 mg for postoperative analgesics. Patient returned to the ED less than one day later complaining of uncontrolled postoperative pain. Tramadol was discontinued and replaced with Norco and Zofran. Patient states that this was effective and that she is now controlling pain with Tylenol and has no need for antiemetics.     OBJECTIVE:   BP (!) 146/70 (Site: Right Arm, Patient Position: Sitting, Cuff Size: Adult)   Pulse 58   Temp 36.6 C (97.8 F) (Temporal)   Resp 18   Ht 1.676 m (5\' 6" )   Wt 49.4 kg (109 lb)   SpO2 99%   BMI 17.59 kg/m     Physical Exam  Vitals reviewed.   Constitutional:       General: She is not in acute distress.     Appearance: Normal appearance.   Cardiovascular:      Rate and Rhythm: Regular rhythm. Bradycardia present.      Pulses: Normal pulses.      Heart sounds: Normal heart sounds.   Pulmonary:      Effort: Pulmonary effort is normal.      Breath sounds: Normal breath sounds.   Skin:     General: Skin is warm and dry.      Capillary Refill: Capillary refill takes less than 2 seconds.   Neurological:      General: No focal deficit present.      Mental Status: She is alert and oriented to person, place, and time. Mental status is at baseline.      Gait: Gait abnormal  (Antalgic gait with wheeled walker).   Psychiatric:         Mood and Affect: Mood normal.         Behavior: Behavior normal.         Thought Content: Thought content normal.        Transition of Care Contact Information  Discharge date: Discharge Date: 05/03/2023  Transition Facility Type--Hospital (Inpatient or Observation)  Facility Name--[facility] Interactive Contact(s):  Completed Contact: 05/06/2023 10:49 AM  First Attempt Call: 05/06/2023 10:49 AM  Contact Method(s)-- Patient/Caregiver Telephone  Clinical Staff Name/Role who Shona Needles LPN     Data Reviewed  Medication Reconciliation completed    Assessment and Plan    ICD-10-CM    1. Hospital discharge follow-up  Z09       2. Status post total replacement of right hip  Z96.641          Other transition actions (Optional) -: Discharge documentation was reviewed, No pending tests or treatments., Durable medical equipment ordered at discharge was obtained, and [employer] additional services are needed or desired. Follow-up with orthopedic surgeon 10/17. Follow-up with PCP 11/20.    Return if symptoms worsen or fail to improve.  Ignacia Marvel, APRN,NP-C  05/13/2023 11:08

## 2023-05-15 ENCOUNTER — Encounter (INDEPENDENT_AMBULATORY_CARE_PROVIDER_SITE_OTHER): Payer: Self-pay | Admitting: Family Medicine

## 2023-05-21 ENCOUNTER — Inpatient Hospital Stay (HOSPITAL_COMMUNITY)
Admission: RE | Admit: 2023-05-21 | Discharge: 2023-05-21 | Disposition: A | Discharge status: Still In Hospital | Payer: Self-pay | Source: Ambulatory Visit | Attending: Orthopaedic Surgery

## 2023-05-21 ENCOUNTER — Encounter (HOSPITAL_COMMUNITY): Payer: Self-pay

## 2023-05-21 ENCOUNTER — Other Ambulatory Visit: Payer: Self-pay

## 2023-05-21 DIAGNOSIS — Z96641 Presence of right artificial hip joint: Secondary | ICD-10-CM

## 2023-05-21 NOTE — PT Evaluation (Signed)
[facility]  Outpatient Physical Therapy  [address]  [place], [address]  [phone]  (Fax) [phone]      Physical Therapy Lower Extremity Evaluation  Goes by "Becki"     Date: 05/21/2023  Patient's Name: Angela Benson  Date of Birth: 03-Aug-1945  Physical Therapy Evaluation     Evaluating Physical Therapist: Calton Dach, DPT   PT diagnosis/Reason for Referral: s/p R THA 10/4  Next Scheduled Physician Appointment: 10/13 Dr. Lindwood Qua  Allergies/Contraindications:  No allergies ; Posterior hip precautions                    SUBJECTIVE  Date of onset: 10/04    Mechanism of injury: 10/04 posterolateral THR (R). Has been working on exercises from MD feels increased pain from the exercises she was given, pain in the knee/shin and lateral pain. Was doing 2x10 reps. Quad set, glute set, SAQ, supine hip abduction. Pt states surgeon told her L hip is good     Current Presentation: Pt presents with FWW    PLOF: No AD at baseline, independent. No fall history     Previous episodes/treatments: Prior PT for the hip     Medications for this problem:  Tylenol  icing on the knee     Past Medical History:   Past Medical History:   Diagnosis Date    Acquired hypothyroidism     B-complex deficiency     Body mass index (BMI) of 19.9 or less in adult     Chronic neck pain     Dextroscoliosis     Essential hypertension     Irritable bowel syndrome     Low back pain     Midline low back pain without sciatica     Non-rheumatic tricuspid valve insufficiency     Osteopenia     Osteoporosis     Palpitations     Raynaud's syndrome     Retrolisthesis of vertebrae     Sensorineural hearing loss (SNHL) of right ear with unrestricted hearing of left ear     SVT (supraventricular tachycardia) (CMS HCC)     Urinary frequency          Past Surgical History:   Past Surgical History:   Procedure Laterality Date    COLONOSCOPY      HX APPENDECTOMY      HX HIP REPLACEMENT      HX HYSTERECTOMY            Patient goals: REDUCE PAIN and NORMALIZE FUNCTION    Occupation:  Works part time at [employer]  , Geneticist, molecular. Gardening frequently at home, enjoys hiking in the woods   Home: w husband 2STE, 1 step off porch first floor living     Next MD visit: 05/11/2023 Dr. Lindwood Qua     Pain location: Some in the groin, laterally into the knee                     Pain description:  Deep pain    Pain frequency:  INTERMITTENT    Pain Rating:      Current Best Worst   Eval 05/21/23 0 0 6 (at night)                          Radiculopathy: to the knee     Pain increases with: BENDING and prolonged sitting  decreases with : WALK lying flat on bed     Sensation: No sensory change     Weakness: Some noted in R leg but not unstable     Sleep affected: Yes    Personal ADLs: Indep w close supervision from husband (pt states she does not feel she needs this)       Patient-Specific Functional Score:    Problem Score   1. Shelving books on low shelves 0   2. Sleeping through the night  8   3. Sitting greater than 30 min  5   Total 4.3   Total score = sum of the activity scores/number of activities    Minimal detectable change (90% CI) for avg score = 2 points    Minimal detectable change (90% CI) for single activity score = 3 points             OBJECTIVE      Strength     Right Left   Hip flexion  4- within avail range empty d/t pain  4-   Hip extension  NT WFL   Hip abduction 4- 4-   Hip adduction NT d/t post op restrictions  WFL   Hip IR NT d/t post op restrictions NT   Hip ER 2+  WFL   Knee flexion  [facility] WFL here down   Knee extension  Empty d/t pain    Ankle DF  WFL here down    Ankle PF      Ankle Inversion     Ankle Eversion         Gait: reciprocal step through gait with walker 115 ft supervision     FUNCTIONAL TESTING       EVAL   5 time STS  From 23" high mat table 14 sec no hands no use of walker    SLS (Seconds)    L:  R:      WNL  Unable             Palpation: Slightly longer RLE       Treatment  provided:REVIEW OF POC AND GOALS WITH PATIENT, ALL QUESTIONS ANSWERED, PATIENT EDUCATION, and THERAPEUTIC EXERCISE     HEP:   Access Code: [email or web address]  URL: [email or web address]  Date: 05/21/2023  Prepared by: Calton Dach    Exercises  - Supine Bridge  - 2 x daily - 7 x weekly - 2 sets - 10 reps  - Supine Quad Set  - 2 x daily - 7 x weekly - 2 sets - 10 reps - 3 seconds hold  - Supine Gluteal Sets  - 2 x daily - 7 x weekly - 2 sets - 10 reps  - Hooklying Clamshell with Resistance  - 2 x daily - 7 x weekly - 2 sets - 10 reps  - Supine Hip Abduction  - 2 x daily - 7 x weekly - 2 sets - 10 reps  - Supine Knee Extension Strengthening  - 2 x daily - 7 x weekly - 2 sets - 10 reps          ASSESSMENT    Impression: Angela Benson is a 77 year old female presenting to outpatient therapy currently three weeks s/p R THA. She presents with limitations in strength and ROM not atypical to this acutely post-operative. She has altered gait mechanics, endurance, and balance. She is currently ambulating with good pattern and cadence using a FWW but is very anxious to transition to cane.  Prior to date of surgery Ms. Ceja was very active in both home and community and she has no concomitant comorbidities that are anticipated to be limiting. She appears very motivated and overall has good rehabilitation potential for return to independent baseline.    Rehab potential: GOOD    Short Term Goals: 6 Weeks     -Pt to be independent and consistent with HEP reporting exercise performance 1-2x daily to facilitate functional carryover of therapeutic gain to daily activity  -Pt to demo SL stance x10 sec or greater on either LE to improve narrow BOS ADLS in home environment such as with bathing and dressing    - Pt to demo 4/5 hip strength B in all directions or greater to improve ability to recruit step strategy in case of LOB         Long Term Goals: 12 Weeks     -PSFS to 7 or greater to reduce participation restriction  secondary to post surgical condition   -Pt to demo modindep floor transfer for return to recreational gardening for improved QOL   -Pt to ambulate community distances independently        PLAN  Patient will attend 2-3 times per week x 12 weeks. Therapy may include, but is not limited to THERAPEUTIC EXERCISES, MYOFASCIAL/JOINT MOBILIZATION, POSTURE/BODY MECHANICS, ERGONOMIC TRAINING, TRANSFER/GAIT TRAINING, HOME INSTRUCTIONS, HEAT/COLD, ULTRASOUND, ELECTRICAL STIMULATION, and KINESIOTAPE    Plan for next visit: Warm up on Nustep (mindful of hip precautions) review HEP and progress, trial cane walking in clinic, progress balance as tolerated.       Evaluation complexity:   Personal factors impacting POC:  None   Co-morbidities impacting POC:  None  Complexity of physical exam: INCLUDING MUSCULOSKELETAL SYSTEM (POSTURE, ROM, STRENGTH, HEIGHT/WEIGHT), INCLUDING NEUROMUSCULAR EXAM (BALANCE, GAIT, LOCOMOTION, MOBILITY), and INCLUDING ACTIVITY/MOBILITY RESTRICTIONS   Clinical Presentation: STABLE   Evaluation Complexity: LOW-HISTORY 0, EXAMINATION 1-2, STABLE PRESENTATION      Total Session Time 45, Timed code minutes 10, and Untimed code minutes 35         Intervention minutes: EVALUATION 35 minutes and THERAPEUTIC EXERCISE 10 minutes    Calton Dach, PT  05/21/2023, 07:20                Certification:    From:______  Through:_________    I certify the need for these services furnished under this plan of treatment and while under my care.    Referring Provider Signature: _______________     Date : _____________________    Printed name of Referring Provider________________________________________

## 2023-05-26 ENCOUNTER — Other Ambulatory Visit: Payer: Self-pay

## 2023-05-26 ENCOUNTER — Ambulatory Visit
Admission: RE | Admit: 2023-05-26 | Discharge: 2023-05-26 | Disposition: A | Discharge status: Still In Hospital | Payer: Medicare PPO | Source: Ambulatory Visit | Attending: Orthopaedic Surgery | Admitting: Orthopaedic Surgery

## 2023-05-26 NOTE — PT Treatment (Addendum)
[facility]  Outpatient Physical Therapy  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  No formal reassessment has been made due to patient attending 2 sessions of the 36 total POC with 0 cancels and 1 no-shows.  Interventions included therex and gait training for the treatment right THA.  Date of service: 05/21/23 - 05/26/23.      Subjective: Pt reports she has not taken any Tylenol since yesterday morning. She notes that she does not have any pain at the moment however 2 night ago she was sitting at the table playing dominos with her husband and when she went to get up she had excruciating pain and went to bed in tears. She notes that the supine clamshells with the yellow band also increased her pain. She states that the pain goes from the groin down into the front of the leg.    Objective: Warm up on Nustep taking care not to break hip precautions followed by review of home exercises and gait with straight cane. Also put 1 layer of adjust a heel in pt's L shoe due to R LE being slightly longer.    Measured ROM: no measurements taken today  EXERCISE/ACTIVITY NAME REPETITIONS RESISTANCE COMPLETED THIS DOS   Nustep   5 minutes L2 Y   Review of HEP     Y   Ambulation with SC in clinic     Y                                                         Assessment: Pt appreciates R LE weakness with  strengthening exercises. She notes that the heel lift that was placed in the L shoe feels really good. She was able to do all exercises today without pain. She was also advised if she continued to have the excruciating pain she may want to call the Dr. And let them know. She did well with ambulation using a SC. She has not met any goals at this time because this is only her 2nd visit, she will continue to benefit from therapy 2-3 times per week for 12 weeks to improve continued deficits seen at initial evaluation last visit.    Short Term Goals: 6 Weeks                  -Pt to be independent and consistent with HEP reporting exercise performance 1-2x daily to facilitate functional carryover of therapeutic gain to daily activity  -Pt to demo SL stance x10 sec or greater on either LE to improve narrow BOS ADLS in home environment such as with  bathing and dressing               - Pt to demo 4/5 hip strength B in all directions or greater to improve ability to recruit step strategy in case of LOB         Long Term Goals: 12 Weeks                  -PSFS to 7 or greater to reduce participation restriction secondary to post surgical condition               -Pt to demo modindep floor transfer for return to recreational gardening for improved QOL               -Pt to ambulate community distances independently    Plan:  Continue PT 2-3 times per week for 12 weeks    Total Session Time 38 and Timed code minutes 38  THERAPEUTIC EXERCISE 28 minutes and GAIT TRAINING 10 MINUTES      Leah Bragg, PTA  05/26/2023, 10:34    Start of Service:     Re-certification:           From:    Through:       Treatment Diagnosis:           I have reviewed this plan of treatment and re-certify a continuing need for service.      Referring Provider Signature:       Date:        Printed Name of Referring Provider: __________________________________________

## 2023-05-28 ENCOUNTER — Ambulatory Visit (HOSPITAL_COMMUNITY): Payer: Self-pay

## 2023-05-28 ENCOUNTER — Encounter (HOSPITAL_COMMUNITY): Payer: Self-pay

## 2023-05-30 ENCOUNTER — Ambulatory Visit (HOSPITAL_COMMUNITY): Payer: Self-pay

## 2023-06-01 ENCOUNTER — Emergency Department
Admission: EM | Admit: 2023-06-01 | Discharge: 2023-06-01 | Disposition: A | Discharge status: Home / Self Care | Payer: Medicare PPO | Attending: Emergency Medicine | Admitting: Emergency Medicine

## 2023-06-01 ENCOUNTER — Emergency Department (HOSPITAL_COMMUNITY): Payer: Medicare PPO

## 2023-06-01 ENCOUNTER — Encounter (HOSPITAL_COMMUNITY): Payer: Self-pay

## 2023-06-01 ENCOUNTER — Other Ambulatory Visit: Payer: Self-pay

## 2023-06-01 DIAGNOSIS — M25551 Pain in right hip: Secondary | ICD-10-CM | POA: Insufficient documentation

## 2023-06-01 DIAGNOSIS — M62838 Other muscle spasm: Secondary | ICD-10-CM | POA: Insufficient documentation

## 2023-06-01 DIAGNOSIS — Z96641 Presence of right artificial hip joint: Secondary | ICD-10-CM | POA: Insufficient documentation

## 2023-06-01 DIAGNOSIS — X501XXA Overexertion from prolonged static or awkward postures, initial encounter: Secondary | ICD-10-CM | POA: Insufficient documentation

## 2023-06-01 DIAGNOSIS — M79651 Pain in right thigh: Secondary | ICD-10-CM | POA: Insufficient documentation

## 2023-06-01 LAB — URINALYSIS, MACROSCOPIC
BILIRUBIN: NEGATIVE mg/dL
BLOOD: NEGATIVE mg/dL
GLUCOSE: NEGATIVE mg/dL
KETONES: NEGATIVE mg/dL
LEUKOCYTES: NEGATIVE WBCs/uL
NITRITE: NEGATIVE
PH: 6.5 (ref 5.0–9.0)
PROTEIN: NEGATIVE mg/dL
SPECIFIC GRAVITY: 1.007 (ref 1.002–1.030)
UROBILINOGEN: NORMAL mg/dL

## 2023-06-01 LAB — URINALYSIS, MICROSCOPIC
SQUAMOUS EPITHELIAL: 1 /[HPF] (ref <28)
WBCS: 1 /[HPF] (ref <6)

## 2023-06-01 MED ORDER — ACETAMINOPHEN 325 MG TABLET
ORAL_TABLET | ORAL | Status: AC
Start: 2023-06-01 — End: 2023-06-01
  Filled 2023-06-01: qty 3

## 2023-06-01 MED ORDER — METHOCARBAMOL 500 MG TABLET
500.0000 mg | ORAL_TABLET | Freq: Once | ORAL | Status: AC
Start: 2023-06-01 — End: 2023-06-01
  Administered 2023-06-01: 500 mg via ORAL

## 2023-06-01 MED ORDER — FENTANYL (PF) 50 MCG/ML INJECTION SOLUTION
50.0000 ug | INTRAMUSCULAR | Status: DC
Start: 2023-06-01 — End: 2023-06-01
  Administered 2023-06-01: 0 ug via INTRAMUSCULAR

## 2023-06-01 MED ORDER — METHOCARBAMOL 500 MG TABLET
ORAL_TABLET | ORAL | Status: AC
Start: 2023-06-01 — End: 2023-06-01
  Filled 2023-06-01: qty 1

## 2023-06-01 MED ORDER — METHOCARBAMOL 500 MG TABLET
500.0000 mg | ORAL_TABLET | Freq: Three times a day (TID) | ORAL | 0 refills | Status: AC | PRN
Start: 2023-06-01 — End: 2023-06-11

## 2023-06-01 MED ORDER — ACETAMINOPHEN 325 MG TABLET
975.0000 mg | ORAL_TABLET | ORAL | Status: AC
Start: 2023-06-01 — End: 2023-06-01
  Administered 2023-06-01: 975 mg via ORAL

## 2023-06-01 NOTE — Discharge Instructions (Signed)
THE PATIENT IS TO FOLLOW UP WITH THE PRIMARY CARE PROVIDER AS SOON AS POSSIBLE BUT NO LATER THAN 3 DAYS FROM LEAVING THE EMERGENCY DEPARTMENT.  IF NO PRIMARY CARE PROVIDER EXISTS, THEN THE PATIENT IS INSTRUCTED TO ESTABLISH CARE WITH A PRIMARY CARE PROVIDER AS SOON AS POSSIBLE BUT NO LATER THAN 3 DAYS FROM LEAVING THE EMERGENCY DEPARTMENT.  FOLLOW-UP WITH ANY SPECIALIST PROVIDER AS INDICATED AS SOON AS POSSIBLE BUT NO LATER THAN 3 DAYS, IF APPLICABLE.  NOTIFY THE PRIMARY CARE PROVIDER THAT YOU WERE IN THE EMERGENCY DEPARTMENT WITHIN 24 HOURS OF DISCHARGE TO FOLLOW-UP ON YOUR RESULTS AND/OR TREATMENTS.  RETURN TO THE EMERGENCY DEPARTMENT IMMEDIATELY IF NEEDED, NO BETTER, WORSE, NEW SYMPTOMS ARISE, OR YOU CANNOT FOLLOW-UP WITH YOUR PRIMARY CARE PROVIDER AND/OR APPLICABLE SPECIALIST IN THE PRESCRIBED TIMEFRAME.

## 2023-06-01 NOTE — ED Nurses Note (Signed)
Patient discharged home with family. AVS reviewed with patient. A written copy of the AVS and discharge instructions were given to the patient. Questions sufficiently answered as needed. Patient encouraged to follow up with PCP as indicated. In the event of an emergency, patient instructed to call 911 or go to the nearest emergency room. Patient left department via wheelchair.

## 2023-06-01 NOTE — ED Triage Notes (Signed)
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 WAS GOING INSIDE OF HOUSE AND GOT HER CANE CAUGHT IN THE STORM DOOR, TWISTED AND PULLED R LEG. C/O SEVERE PAIN IN R THIGH AND HIP.

## 2023-06-01 NOTE — ED Provider Notes (Signed)
[facility]  Emergency Department  Attending Provider Note      CHIEF COMPLAINT  Chief Complaint   Patient presents with    Hip Pain     HISTORY OF PRESENT ILLNESS  Angela Benson, date of birth 07-29-1946, is a 77 y.o. female who presented to the Emergency Department.    PATIENT PRESENTS EMERGENCY DEPARTMENT TODAY AFTER STUMBLING WITH HER CANE AND TWISTING HER RIGHT LOWER EXTREMITY.  RECENT HIP REPLACEMENT OF THE RIGHT HIP WITHIN THE LAST MONTH OR SO.  SYMPTOMS WORSE WITH ACTIVITY BETTER WITH REST MILD-TO-MODERATE IN NATURE.  NO HEAD TRAUMA OR LOSS OF CONSCIOUSNESS.  NO FEVERS CHILLS CHEST PAIN PALPITATION SHORTNESS OF BREATH COUGH DYSURIA HEMATURIA NAUSEA OR VOMITING.  COMPREHENSIVE 10+ REVIEW OF SYSTEMS IS OTHERWISE NEGATIVE.  THERE ARE NO OTHER ACUTE COMPLAINTS REPORTED BY THE PATIENT.    PAST MEDICAL/SURGICAL/FAMILY/SOCIAL HISTORY  Past Medical History:   Diagnosis Date    Acquired hypothyroidism     B-complex deficiency     Body mass index (BMI) of 19.9 or less in adult     Chronic neck pain     Dextroscoliosis     Essential hypertension     Irritable bowel syndrome     Low back pain     Midline low back pain without sciatica     Non-rheumatic tricuspid valve insufficiency     Osteopenia     Osteoporosis     Palpitations     Raynaud's syndrome     Retrolisthesis of vertebrae     Sensorineural hearing loss (SNHL) of right ear with unrestricted hearing of left ear     SVT (supraventricular tachycardia) (CMS HCC)     Urinary frequency        Past Surgical History:   Procedure Laterality Date    COLONOSCOPY      HX APPENDECTOMY      HX HIP REPLACEMENT      HX HYSTERECTOMY         Family Medical History:       Problem Relation (Age of Onset)    Arthritis-osteo Father    Breast Cancer Maternal Aunt    Congestive Heart Failure Mother    Dementia Mother    Diabetes type II Father    Elevated Lipids Father    Heart Attack Father    Hypertension (High Blood Pressure) Mother, Father    Melanoma  Father    No Known Problems Sister, Brother, Maternal Grandmother, Maternal Grandfather, Paternal Grandmother, Paternal Grandfather, Daughter, Son, Maternal Uncle, Paternal Aunt, Paternal Uncle, Other          Social History     Socioeconomic History    Marital status: Married   Tobacco Use    Smoking status: Never     Passive exposure: Never    Smokeless tobacco: Never   Vaping Use    Vaping status: Never Used   Substance and Sexual Activity    Alcohol use: Yes     Alcohol/week: 7.0 standard drinks of alcohol     Types: 7 Glasses of wine per week     Comment: Glass of wine every evening.    Drug use: Never     Social Determinants of Health     Transportation Needs: Low Risk  (05/02/2023)    Transportation Needs     SDOH Transportation: No   Social Connections: Low Risk  (05/02/2023)    Social Connections     SDOH Social Isolation: 5 or more times  a week      ALLERGIES  Allergies   Allergen Reactions    Nitrofurantoin Hives/ Urticaria     Flu-like symptoms/hives    Penicillins Hives/ Urticaria     Hives/rash       PHYSICAL EXAM  VITAL SIGNS:  Filed Vitals:    06/01/23 1645 06/01/23 1700 06/01/23 1715 06/01/23 1730   BP: (!) 150/80 (!) 154/128 (!) 166/85    Pulse:       Resp:       Temp:       SpO2: 100% 100% 99% 98%     GENERAL: PATIENT IS ALERT AND ORIENTED TO PERSON, PLACE, AND TIME.  HEAD: NORMOCEPHALIC AND ATRAUMATIC.  EYES: PUPILS EQUALLY ROUND AND REACT TO LIGHT. EXTRAOCULAR MOVEMENTS INTACT.  EARS: GROSS HEARING INTACT. EXTERNAL EARS WITHIN NORMAL LIMITS.  NOSE: NO SEPTAL DEVIATION. NASAL PASSAGES CLEAR.  THROAT: MOIST ORAL MUCOSA.   NECK: SUPPLE. TRACHEA MIDLINE.  HEART: REGULAR, RATE, AND RHYTHM.  LUNGS: CLEAR TO AUSCULTATION BILATERAL BUT DECREASED.  ABDOMEN: SOFT, NON-TENDER, NON-DISTENDED, AND BOWEL SOUNDS ARE PRESENT.  GENITOURINARY: DEFERRED.  RECTAL: DEFERRED.  EXTREMITIES: NO CYANOSIS, CLUBBING, OR EDEMA.  SKIN: WARM AND DRY.  MUSCULOSKELETAL: SPASM AND TENDERNESS ON RIGHT MEDIAL THIGH.  NEUROLOGIC:  CRANIAL NERVES II THROUGH XII ARE GROSSLY INTACT. SENSATION TO LIGHT TOUCH IS INTACT.  PSYCHIATRIC: JUDGMENT AND INSIGHT ARE SEEMINGLY INTACT. MOOD AND AFFECT ARE APPROPRIATE FOR THE SITUATION.    DIAGNOSTICS  Labs:  Labs listed below were reviewed and interpreted by me.  Results for orders placed or performed during the hospital encounter of 06/01/23   URINALYSIS, MACROSCOPIC   Result Value Ref Range    COLOR Yellow Colorless, Light Yellow, Yellow    APPEARANCE Clear Clear    SPECIFIC GRAVITY 1.007 1.002 - 1.030    PH 6.5 5.0 - 9.0    LEUKOCYTES Negative Negative, 100  WBCs/uL    NITRITE Negative Negative    PROTEIN Negative Negative, 10 , 20  mg/dL    GLUCOSE Negative Negative, 30  mg/dL    KETONES Negative Negative, Trace mg/dL    BILIRUBIN Negative Negative, 0.5 mg/dL    BLOOD Negative Negative, 0.03 mg/dL    UROBILINOGEN Normal Normal mg/dL   URINALYSIS, MICROSCOPIC   Result Value Ref Range    BACTERIA Rare (A) Negative /hpf    MUCOUS Rare Rare, Occasional, Few /hpf    AMORPHOUS SEDIMENT Rare (A) (none) /hpf    RBCS      WBCS 1 <6 /hpf    SQUAMOUS EPITHELIAL 1 <28 /hpf     Radiology:  Results for orders placed or performed during the hospital encounter of 06/01/23   XR HIPS BILATERAL W PELVIS 3-4 VIEWS     Status: None    Narrative    Mersades T Brickey    RADIOLOGIST: Wynema Birch, MD    XR HIPS BILATERAL W PELVIS 3-4 VIEWS performed on 06/01/2023 5:16 PM    CLINICAL HISTORY: FALL.  fall    TECHNIQUE:  5 total views  of both hips including AP pelvis.  COMPARISON:  05/04/2023    FINDINGS:  The bony pelvic ring appears intact.  SI joints are unremarkable.    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    Left hip joint space is unremarkable. There are some small osteophytes.    There are degenerative changes in the included lower lumbar spine.    There is a large volume of stool.  Impression    NO ACUTE FRACTURE OR DISLOCATION.     If acute hip fracture is suspected after a fall or minor  trauma and initial radiographs are negative then MRI of the pelvis and affected hip without IV contrast or CT of the pelvis and hips without IV contrast is usually appropriate as the next imaging study. (ACR Appropriateness Criteria: Acute Hip Pain-Suspected Fracture, 2018)           Radiologist location ID: [id number]     XR FEMUR RIGHT     Status: None    Narrative    Darin T Trentman    RADIOLOGIST: Wynema Birch, MD    XR FEMUR RIGHT- 2 VIEWS performed on 06/01/2023 5:16 PM    CLINICAL HISTORY: FALL.  fall    TECHNIQUE:  2 views of the right femur.    COMPARISON:  None.    FINDINGS:   No fracture.  No suspicious bone lesion.  Normal alignment at the hip and knee.  Soft tissues are unremarkable.  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 Osteopenia is also suspected.      Impression    NO ACUTE FRACTURE OR DISLOCATION.         Radiologist location ID: [id number]         ED COURSE/MEDICAL DECISION MAKING  Medications Administered in the ED   fentaNYL (SUBLIMAZE) 50 mcg/mL injection (0 mcg IntraMUSCULAR Not Given 06/01/23 1730)   methocarbamol (ROBAXIN) tablet (has no administration in time range)   acetaminophen (TYLENOL) tablet (has no administration in time range)      ED Course as of 06/01/23 1804   Sun Jun 01, 2023   1754 XR HIPS BILATERAL W PELVIS 3-4 VIEWS  TODAY I, Cassanda Walmer A. Nakoa Ganus, D.O., PERSONALLY VISUALIZED THE PATIENT'S DIAGNOSTIC IMAGING STUDIES.  DEFER TO THE RADIOLOGIST'S REPORT FOR THE FINAL DEFINITIVE RADIOLOGY READING.  MY INITIAL INDEPENDENT INTERPRETATION IS AS FOLLOWS, BUT NOT LIMITED TO:  NO OBVIOUS FRACTURE     1759 SPECIFIC GRAVITY, URINE: 1.007  WNL      Medical Decision Making  Problems Addressed:  Right hip pain: acute illness or injury  Right thigh pain: acute illness or injury    Amount and/or Complexity of Data Reviewed  Labs: ordered. Decision-making details documented in ED Course.  Radiology: ordered and independent interpretation performed. Decision-making details  documented in ED Course.    Risk  Prescription drug management.  Diagnosis or treatment significantly limited by social determinants of health.      CLINICAL IMPRESSION  Clinical Impression   Right hip pain (Primary)   Right thigh pain - WITH SPASM OF MUSCLE     DISPOSITION  Discharged       DISCHARGE MEDICATIONS  Current Discharge Medication List        START taking these medications    Details   methocarbamoL (ROBAXIN) 500 mg Oral Tablet Take 1 Tablet (500 mg total) by mouth Three times a day as needed for up to 10 days  Qty: 30 Tablet, Refills: 0             /Skarleth Delmonico A. [place], DO, MBA   06/01/2023, 18:03   [facility]  Department of Emergency Medicine  [facility]    This note was partially generated using MModal Fluency Direct system, and there may be some incorrect words, spellings, and punctuation that were not noted in checking the note before saving.

## 2023-06-12 ENCOUNTER — Other Ambulatory Visit (INDEPENDENT_AMBULATORY_CARE_PROVIDER_SITE_OTHER): Payer: Self-pay | Admitting: Family Medicine

## 2023-06-18 ENCOUNTER — Encounter (INDEPENDENT_AMBULATORY_CARE_PROVIDER_SITE_OTHER): Payer: Self-pay | Admitting: Family Medicine

## 2023-06-18 ENCOUNTER — Ambulatory Visit (INDEPENDENT_AMBULATORY_CARE_PROVIDER_SITE_OTHER): Payer: Medicare PPO | Admitting: Family Medicine

## 2023-06-18 ENCOUNTER — Other Ambulatory Visit: Payer: Self-pay

## 2023-06-18 VITALS — BP 147/78 | HR 58 | Temp 98.1°F | Resp 16 | Ht 65.0 in | Wt 104.0 lb

## 2023-06-18 DIAGNOSIS — M79671 Pain in right foot: Secondary | ICD-10-CM

## 2023-06-18 DIAGNOSIS — I10 Essential (primary) hypertension: Secondary | ICD-10-CM

## 2023-06-18 DIAGNOSIS — Z0000 Encounter for general adult medical examination without abnormal findings: Secondary | ICD-10-CM

## 2023-06-18 DIAGNOSIS — I471 Supraventricular tachycardia, unspecified (CMS HCC): Secondary | ICD-10-CM

## 2023-06-18 DIAGNOSIS — Z681 Body mass index (BMI) 19 or less, adult: Secondary | ICD-10-CM

## 2023-06-18 DIAGNOSIS — E46 Unspecified protein-calorie malnutrition: Secondary | ICD-10-CM

## 2023-06-18 DIAGNOSIS — S7291XD Unspecified fracture of right femur, subsequent encounter for closed fracture with routine healing: Secondary | ICD-10-CM

## 2023-06-18 DIAGNOSIS — M50123 Cervical disc disorder at C6-C7 level with radiculopathy: Secondary | ICD-10-CM

## 2023-06-18 DIAGNOSIS — E039 Hypothyroidism, unspecified: Secondary | ICD-10-CM

## 2023-06-18 DIAGNOSIS — M810 Age-related osteoporosis without current pathological fracture: Secondary | ICD-10-CM

## 2023-06-18 DIAGNOSIS — S7290XA Unspecified fracture of unspecified femur, initial encounter for closed fracture: Secondary | ICD-10-CM | POA: Insufficient documentation

## 2023-06-18 DIAGNOSIS — Z96641 Presence of right artificial hip joint: Secondary | ICD-10-CM

## 2023-06-18 NOTE — Nursing Note (Signed)
06/18/23 1541   Medicare Wellness Assessment   Medicare initial or wellness physical in the last year? No   Advance Directives   Does patient have a living will or MPOA Yes   Has patient provided [employer] with a copy? Yes   Activities of Daily Living   Do you need help with dressing, bathing, or walking? Yes  (uses a walker)   Do you need help with shopping, housekeeping, medications, or finances? Yes  (shopping secondary to hip replacement and femur fracture)   Do you have rugs in hallways, broken steps, or poor lighting? No   Do you have grab bars in your bathroom, non-slip strips in your tub, and hand rails on your stairs? Yes   Cognitive Function Screen   What is you age? 1  (36)   What is the time to the nearest hour? 1  (4)   What is the year? 1  (2024)   What is the name of this clinic? 1  ([facility])   Can the patient recognize two persons (the doctor, the nurse, home help, etc.)? 1   What is the date of your birth? (day and month sufficient)  1  (September 21, 1945)   In what year did World War II end? 0  (1944)   Who is the current president of the [place]? 1  (Biden)   Count from 20 down to 1? 1   What address did I give you earlier? 1  ([address])   Total Score 9   Interpretation of Total Score Greater than 6 Normal   Depression Screen   Little interest or pleasure in doing things. 0   Feeling down, depressed, or hopeless 0   PHQ 2 Total 0   Pain Score   Pain Score Zero   Substance Use Screening   In Past 12 MONTHS, how often have you used any tobacco product (for example, cigarettes, e-cigarettes, cigars, pipes, or smokeless tobacco)? Never   In the PAST 12 MONTHS, how often have you had 5 (men)/4 (women) or more drinks containing alcohol in one day? Never   In the PAST 12 months, how often have you used any prescription medications just for the feeling, more than prescribed, or that were not prescribed for you? Prescriptions may include: opioids, benzodiazepines, medications for ADHD Never   In  the PAST 12 MONTHS, how often have you used any drugs, including marijuana, cocaine or crack, heroin, methamphetamine, hallucinogens, ecstasy/MDMA? Never   Hearing Screen   Have you noticed any hearing difficulties? No   After whispering 9-1-6 how many numbers did the patient repeat correctly? 3   After whispering 4-7-8 how many numbers did the patient repeat correctly? 3   Total Correct 6   Fall Risk Assessment   Do you feel unsteady when standing or walking? No   Do you worry about falling? Yes   Have you fallen in the past year? No   Urinary Incontinence Screen   Do you ever leak urine when you don't want to? No

## 2023-06-18 NOTE — Nursing Note (Signed)
06/18/23 1532   Domestic Violence   Because we are aware of abuse and domestic violence today, we ask all patients: Are you being hurt, hit, or frightened by anyone at your home or in your life?  N   Basic Needs   Do you have any basic needs within your home that are not being met? (such as Food, Shelter, [employer] fast streamer, Tranportation, paying for bills and/or medications) N

## 2023-06-18 NOTE — Nursing Note (Signed)
06/18/23 1538   Comprehensive Health Assessment-Adult   Do you wish to complete this form? Yes   During the past 4 weeks, how would you rate your health in general? Good   During the past 4 weeks, how much difficulty have you had doing your usual activities inside and outside your home because of medical or emotional problems? Could not do most activities  (hairline fracture and right hip replacement)   During the past 4 weeks, was someone available to help you if you needed and wanted help? Yes, as much as I wanted   In the past year, how many times have you gone to the emergency department or been admitted to a hospital for a health problem? 2-4 times   Are you generally satisfied with your sleep? Yes   Do you have enough money to buy things you need in everyday life, such as food, clothing, medicines, and housing? Yes, always   Can you get to places beyond walking distance without help?  (For example, can you drive your own car or travel alone on buses)? No  (hairline fracture in right femur and hip replacement)   Do you fasten your seatbelt when you are in a car? Yes, usually   Do you exercise 20 minutes 3 or more days per week (such as walking, dancing, biking, mowing grass, swimming)? No, I usually don't exercise this much   How often do you eat food that is healthy (fruits, vegetables, lean meats) instead of unhealthy (sweets, fast food, junk food, fatty foods)? Almost always   Have your parents, brothers or sisters had any of the following problems before the age of 33? (check all that apply) Heart problems, or hardening of the arteries;High cholesterol;Other family illness  (HTN)   How often do you have trouble taking medicines the eay you are told to take them? I always take them as prescribed   Do you need any help communicating with your doctors and nurses because of vision or hearing problems? No   During the past 12 months, have you experienced confusion or memory loss that is happening more often  or is getting worse? No   Do you have one person you think of as your personal doctor (primary care provider or family doctor)? Yes   If you are seeing a Primary Care Provider (PCP) or family doctor. please list their name Dr. Gaynell Face Long   Are you now also seeing any specialist physician(s) (such as eye doctor, foot doctor, skin doctor)? Yes   If you are seeing a specialist for anything such as foot, eye, skin, etc.  please list their name(s) Dr. Tonny Bollman, Dr. Lindwood Qua   How confident are you that you can control or manage most of your health problems? Very confident

## 2023-06-18 NOTE — Progress Notes (Signed)
FAMILY MEDICINE, MEDICAL OFFICE BUILDING  [address]  [place] [address]    Medicare Annual Wellness Visit    Name: Angela Benson MRN:  [id number]   Date: 06/18/2023 Age: 77 y.o.       SUBJECTIVE:   Angela Benson is a 77 y.o. female for presenting for Medicare Wellness exam.   I have reviewed and reconciled the medication list with the patient today.        06/18/2023     3:38 PM   Comprehensive Health Assessment-Adult   Do you wish to complete this form? Yes   During the past 4 weeks, how would you rate your health in general? Good   During the past 4 weeks, how much difficulty have you had doing your usual activities inside and outside your home because of medical or emotional problems? Could not do most activities       hairline fracture and right hip replacement   During the past 4 weeks, was someone available to help you if you needed and wanted help? Yes, as much as I wanted   In the past year, how many times have you gone to the emergency department or been admitted to a hospital for a health problem? 2-4 times   Are you generally satisfied with your sleep? Yes   Do you have enough money to buy things you need in everyday life, such as food, clothing, medicines, and housing? Yes, always   Can you get to places beyond walking distance without help?  (For example, can you drive your own car or travel alone on buses)? No       hairline fracture in right femur and hip replacement   Do you fasten your seatbelt when you are in a car? Yes, usually   Do you exercise 20 minutes 3 or more days per week (such as walking, dancing, biking, mowing grass, swimming)? No, I usually don't exercise this much   How often do you eat food that is healthy (fruits, vegetables, lean meats) instead of unhealthy (sweets, fast food, junk food, fatty foods)? Almost always   Have your parents, brothers or sisters had any of the following problems before the age of 9? (check all that apply) Heart problems, or hardening  of the arteries;High cholesterol;Other family illness       HTN   How often do you have trouble taking medicines the eay you are told to take them? I always take them as prescribed   Do you need any help communicating with your doctors and nurses because of vision or hearing problems? No   During the past 12 months, have you experienced confusion or memory loss that is happening more often or is getting worse? No   Do you have one person you think of as your personal doctor (primary care provider or family doctor)? Yes   If you are seeing a Primary Care Provider (PCP) or family doctor. please list their name Dr. Gaynell Face Levon Boettcher   Are you now also seeing any specialist physician(s) (such as eye doctor, foot doctor, skin doctor)? Yes   If you are seeing a specialist for anything such as foot, eye, skin, etc.  please list their name(s) Dr. Tonny Bollman, Dr. Lindwood Qua   How confident are you that you can control or manage most of your health problems? Very confident       I have reviewed and updated as appropriate the past medical, family and social history. 06/18/2023 as summarized below:  Past Medical History:   Diagnosis Date    Acquired hypothyroidism     B-complex deficiency     Body mass index (BMI) of 19.9 or less in adult     Chronic neck pain     Dextroscoliosis     Essential hypertension     Irritable bowel syndrome     Low back pain     Midline low back pain without sciatica     Non-rheumatic tricuspid valve insufficiency     Osteopenia     Osteoporosis     Palpitations     Raynaud's syndrome     Retrolisthesis of vertebrae     Sensorineural hearing loss (SNHL) of right ear with unrestricted hearing of left ear     SVT (supraventricular tachycardia) (CMS HCC)     Urinary frequency      Past Surgical History:   Procedure Laterality Date    Colonoscopy      Hx appendectomy      Hx hip replacement      Hx hysterectomy       Current Outpatient Medications   Medication Sig    acetaminophen (TYLENOL) 500 mg Oral Tablet Take  1 Tablet (500 mg total) by mouth Every 4 hours as needed for Pain    aspirin (ECOTRIN) 81 mg Oral Tablet, Delayed Release (E.C.) Take 1 Tablet (81 mg total) by mouth Twice daily X 6 weeks    Ca carb-Ca gluc-Mg ox-Mg gluco (CALCIUM MAGNESIUM) 500 mg calcium -250 mg Oral Tablet Take 1 Tablet by mouth Once a day    cholecalciferol, vitamin D3, 50 mcg (2,000 unit) Oral Tablet Take 1 Tablet (2,000 Units total) by mouth Once a day    coenzyme Q10 100 mg Oral Capsule Take 1 Capsule (100 mg total) by mouth Once a day    cyanocobalamin (VITAMIN B 12) 1,000 mcg Oral Tablet Take 1 Tablet (1,000 mcg total) by mouth Once a day    levothyroxine (SYNTHROID) 50 mcg Oral Tablet TAKE (1) TABLET DAILY    lisinopriL (PRINIVIL) 20 mg Oral Tablet TAKE (1) TABLET DAILY    ondansetron (ZOFRAN ODT) 4 mg Oral Tablet, Rapid Dissolve Take 1 Tablet (4 mg total) by mouth Every 8 hours as needed for Nausea/Vomiting     Family Medical History:       Problem Relation (Age of Onset)    Arthritis-osteo Father    Breast Cancer Maternal Aunt    Congestive Heart Failure Mother    Dementia Mother    Diabetes type II Father    Elevated Lipids Father    Heart Attack Father    Hypertension (High Blood Pressure) Mother, Father    Melanoma Father    No Known Problems Sister, Brother, Maternal Grandmother, Maternal Grandfather, Paternal Grandmother, Paternal Grandfather, Daughter, Son, Maternal Uncle, Paternal Aunt, Paternal Uncle, Other            Social History     Socioeconomic History    Marital status: Married   Tobacco Use    Smoking status: Never     Passive exposure: Never    Smokeless tobacco: Never   Vaping Use    Vaping status: Never Used   Substance and Sexual Activity    Alcohol use: Yes     Alcohol/week: 7.0 standard drinks of alcohol     Types: 7 Glasses of wine per week     Comment: Glass of wine every evening.    Drug use: Never     Social  Determinants of Health     Transportation Needs: Low Risk  (05/02/2023)    Transportation Needs     SDOH  Transportation: No   Social Connections: Low Risk  (05/02/2023)    Social Connections     SDOH Social Isolation: 5 or more times a week   Health Literacy: Low Risk  (05/02/2023)    Health Literacy     SDOH Health Literacy: Never         List of Current Health Care Providers   Care Team       PCP       Name Type Specialty Phone Number    Mickey Farber, DO Physician [facility] [phone]              Care Team       No care team found                      Health Maintenance   Topic Date Due    Adult Tdap-Td (1 - Tdap) Never done    Shingles Vaccine (2 of 2) 10/08/2018    Pneumococcal Vaccination, Age 62+ (2 of 2 - PCV) 08/14/2019    Medicare Annual Wellness Visit - Calendar Year Insurers  Never done    Covid-19 Vaccine (6 - 2024-25 season) 03/30/2023    Osteoporosis screening  05/22/2024    Depression Screening  06/17/2024    Influenza Vaccine  Completed    Hepatitis C screening  Discontinued     Medicare Wellness Assessment   Medicare initial or wellness physical in the last year?: No  Advance Directives   Does patient have a living will or MPOA: Yes   Has patient provided [employer] with a copy?: Yes              Activities of Daily Living   Do you need help with dressing, bathing, or walking?: Yes (uses a walker)   Do you need help with shopping, housekeeping, medications, or finances?: Yes (shopping secondary to hip replacement and femur fracture)   Do you have rugs in hallways, broken steps, or poor lighting?: No   Do you have grab bars in your bathroom, non-slip strips in your tub, and hand rails on your stairs?: Yes   Cognitive Function Screen (1=Yes, 0=No)   What is you age?: Correct (77)   What is the time to the nearest hour?: Correct (4)   What is the year?: Correct (2024)   What is the name of this clinic?: Correct ([facility])   Can the patient recognize two persons (the doctor, the nurse, home help, etc.)?: Correct   What is the date of your birth? (day and month sufficient) : Correct (1945/11/19)    In what year did World War II end?: Incorrect (1944)   Who is the current president of the [place]?: Correct (Biden)   Count from 20 down to 1?: Correct   What address did I give you earlier?: Correct ([address]   Total Score: 9   Interpretation of Total Score: Greater than 6 Normal   Fall Risk Screen   Do you feel unsteady when standing or walking?: No  Do you worry about falling?: Yes  Have you fallen in the past year?: No   Depression Screen     Little interest or pleasure in doing things.: Not at all  Feeling down, depressed, or hopeless: Not at all  PHQ 2 Total: 0     Pain  Score   Pain Score:   0 - No pain    Substance Use-Abuse Screening     Tobacco Use     In Past 12 MONTHS, how often have you used any tobacco product (for example, cigarettes, e-cigarettes, cigars, pipes, or smokeless tobacco)?: Never     Alcohol use     In the PAST 12 MONTHS, how often have you had 5 (men)/4 (women) or more drinks containing alcohol in one day?: Never     Prescription Drug Use     In the PAST 12 months, how often have you used any prescription medications just for the feeling, more than prescribed, or that were not prescribed for you? Prescriptions may include: opioids, benzodiazepines, medications for ADHD: Never           Illicit Drug Use   In the PAST 12 MONTHS, how often have you used any drugs, including marijuana, cocaine or crack, heroin, methamphetamine, hallucinogens, ecstasy/MDMA?: Never        Hearing Screen   Have you noticed any hearing difficulties?: No  After whispering 9-1-6 how many numbers did the patient repeat correctly?: 3  After whispering 4-7-8 how many numbers did the patient repeat correctly?: 3       Vision Screen             Urine Incontinence Screen   Urinary Incontinence Screen  Do you ever leak urine when you don't want to?: No                     OBJECTIVE:   BP (!) 147/78 (Site: Right Arm, Patient Position: Sitting, Cuff Size: Adult)   Pulse 58   Temp 36.7 C (98.1 F)  (Temporal)   Resp 16   Ht 1.651 m (5\' 5" )   Wt 47.2 kg (104 lb)   SpO2 99%   BMI 17.31 kg/m        Other appropriate exam:    Health Maintenance Due   Topic Date Due    Adult Tdap-Td (1 - Tdap) Never done    Shingles Vaccine (2 of 2) 10/08/2018    Pneumococcal Vaccination, Age 40+ (2 of 2 - PCV) 08/14/2019    Medicare Annual Wellness Visit - Calendar Year Insurers  Never done    Covid-19 Vaccine (6 - 2024-25 season) 03/30/2023      ASSESSMENT & PLAN:  Problem List Items Addressed This Visit          Cardiovascular System    Essential hypertension - Primary    SVT (supraventricular tachycardia) (CMS HCC)       Endocrine    Acquired hypothyroidism    Protein-calorie malnutrition, unspecified severity (CMS HCC)       Musculoskeletal    Osteoporosis       Other    Body mass index (BMI) of 19.9 or less in adult     Other Visit Diagnoses       Aftercare for healing traumatic closed fracture of right femur                 Identified Risk Factors/ Recommended Actions     Fall Risk Follow up plan of care: Vitamin D supplementation advised  Discussed optimizing home safety  Manage & monitor hypotension  Medications managed to minimize fall risk  Footwear and potential problems addressed  The PHQ 2 Total: 0 depression screen is interpreted as negative.      Today we talked about building up strength in  his lower extremities physical therapy occupational therapy stay on healthy diet vaccinations and exercise.        No orders of the defined types were placed in this encounter.         The patient has been educated about risk factors and recommended preventive care. Written Prevention Plan completed/ updated and given to patient (see After Visit Summary).    Return in about 1 year (around 06/17/2024).    Mickey Farber, DOFAMILY MEDICINE, MEDICAL OFFICE BUILDING  [address]  [place] [address]    Medicare Annual Wellness Visit    Name: Angela Benson MRN:  [id number]   Date: 06/18/2023 Age: 77 y.o.        SUBJECTIVE:   Angela Benson is a 77 y.o. female for presenting for Medicare Wellness exam.   I have reviewed and reconciled the medication list with the patient today.        06/18/2023     3:38 PM   Comprehensive Health Assessment-Adult   Do you wish to complete this form? Yes   During the past 4 weeks, how would you rate your health in general? Good   During the past 4 weeks, how much difficulty have you had doing your usual activities inside and outside your home because of medical or emotional problems? Could not do most activities       hairline fracture and right hip replacement   During the past 4 weeks, was someone available to help you if you needed and wanted help? Yes, as much as I wanted   In the past year, how many times have you gone to the emergency department or been admitted to a hospital for a health problem? 2-4 times   Are you generally satisfied with your sleep? Yes   Do you have enough money to buy things you need in everyday life, such as food, clothing, medicines, and housing? Yes, always   Can you get to places beyond walking distance without help?  (For example, can you drive your own car or travel alone on buses)? No       hairline fracture in right femur and hip replacement   Do you fasten your seatbelt when you are in a car? Yes, usually   Do you exercise 20 minutes 3 or more days per week (such as walking, dancing, biking, mowing grass, swimming)? No, I usually don't exercise this much   How often do you eat food that is healthy (fruits, vegetables, lean meats) instead of unhealthy (sweets, fast food, junk food, fatty foods)? Almost always   Have your parents, brothers or sisters had any of the following problems before the age of 30? (check all that apply) Heart problems, or hardening of the arteries;High cholesterol;Other family illness       HTN   How often do you have trouble taking medicines the eay you are told to take them? I always take them as prescribed   Do you need  any help communicating with your doctors and nurses because of vision or hearing problems? No   During the past 12 months, have you experienced confusion or memory loss that is happening more often or is getting worse? No   Do you have one person you think of as your personal doctor (primary care provider or family doctor)? Yes   If you are seeing a Primary Care Provider (PCP) or family doctor. please list their name Dr. Gaynell Face Ellsie Violette   Are you now also  seeing any specialist physician(s) (such as eye doctor, foot doctor, skin doctor)? Yes   If you are seeing a specialist for anything such as foot, eye, skin, etc.  please list their name(s) Dr. Tonny Bollman, Dr. Lindwood Qua   How confident are you that you can control or manage most of your health problems? Very confident       I have reviewed and updated as appropriate the past medical, family and social history. 06/18/2023 as summarized below:  Past Medical History:   Diagnosis Date    Acquired hypothyroidism     B-complex deficiency     Body mass index (BMI) of 19.9 or less in adult     Chronic neck pain     Dextroscoliosis     Essential hypertension     Irritable bowel syndrome     Low back pain     Midline low back pain without sciatica     Non-rheumatic tricuspid valve insufficiency     Osteopenia     Osteoporosis     Palpitations     Raynaud's syndrome     Retrolisthesis of vertebrae     Sensorineural hearing loss (SNHL) of right ear with unrestricted hearing of left ear     SVT (supraventricular tachycardia) (CMS HCC)     Urinary frequency      Past Surgical History:   Procedure Laterality Date    Colonoscopy      Hx appendectomy      Hx hip replacement      Hx hysterectomy       Current Outpatient Medications   Medication Sig    acetaminophen (TYLENOL) 500 mg Oral Tablet Take 1 Tablet (500 mg total) by mouth Every 4 hours as needed for Pain    aspirin (ECOTRIN) 81 mg Oral Tablet, Delayed Release (E.C.) Take 1 Tablet (81 mg total) by mouth Twice daily X 6 weeks    Ca  carb-Ca gluc-Mg ox-Mg gluco (CALCIUM MAGNESIUM) 500 mg calcium -250 mg Oral Tablet Take 1 Tablet by mouth Once a day    cholecalciferol, vitamin D3, 50 mcg (2,000 unit) Oral Tablet Take 1 Tablet (2,000 Units total) by mouth Once a day    coenzyme Q10 100 mg Oral Capsule Take 1 Capsule (100 mg total) by mouth Once a day    cyanocobalamin (VITAMIN B 12) 1,000 mcg Oral Tablet Take 1 Tablet (1,000 mcg total) by mouth Once a day    levothyroxine (SYNTHROID) 50 mcg Oral Tablet TAKE (1) TABLET DAILY    lisinopriL (PRINIVIL) 20 mg Oral Tablet TAKE (1) TABLET DAILY    ondansetron (ZOFRAN ODT) 4 mg Oral Tablet, Rapid Dissolve Take 1 Tablet (4 mg total) by mouth Every 8 hours as needed for Nausea/Vomiting     Family Medical History:       Problem Relation (Age of Onset)    Arthritis-osteo Father    Breast Cancer Maternal Aunt    Congestive Heart Failure Mother    Dementia Mother    Diabetes type II Father    Elevated Lipids Father    Heart Attack Father    Hypertension (High Blood Pressure) Mother, Father    Melanoma Father    No Known Problems Sister, Brother, Maternal Grandmother, Maternal Grandfather, Paternal Grandmother, Paternal Grandfather, Daughter, Son, Maternal Uncle, Paternal Aunt, Paternal Uncle, Other            Social History     Socioeconomic History    Marital status: Married   Tobacco Use  Smoking status: Never     Passive exposure: Never    Smokeless tobacco: Never   Vaping Use    Vaping status: Never Used   Substance and Sexual Activity    Alcohol use: Yes     Alcohol/week: 7.0 standard drinks of alcohol     Types: 7 Glasses of wine per week     Comment: Glass of wine every evening.    Drug use: Never     Social Determinants of Health     Transportation Needs: Low Risk  (05/02/2023)    Transportation Needs     SDOH Transportation: No   Social Connections: Low Risk  (05/02/2023)    Social Connections     SDOH Social Isolation: 5 or more times a week   Health Literacy: Low Risk  (05/02/2023)    Health Literacy      SDOH Health Literacy: Never         List of Current Health Care Providers   Care Team       PCP       Name Type Specialty Phone Number    Mickey Farber, DO Physician [facility] [phone]              Care Team       No care team found                      Health Maintenance   Topic Date Due    Adult Tdap-Td (1 - Tdap) Never done    Shingles Vaccine (2 of 2) 10/08/2018    Pneumococcal Vaccination, Age 40+ (2 of 2 - PCV) 08/14/2019    Medicare Annual Wellness Visit - Calendar Year Insurers  Never done    Covid-19 Vaccine (6 - 2024-25 season) 03/30/2023    Osteoporosis screening  05/22/2024    Depression Screening  06/17/2024    Influenza Vaccine  Completed    Hepatitis C screening  Discontinued     Medicare Wellness Assessment   Medicare initial or wellness physical in the last year?: No  Advance Directives   Does patient have a living will or MPOA: Yes   Has patient provided [employer] with a copy?: Yes              Activities of Daily Living   Do you need help with dressing, bathing, or walking?: Yes (uses a walker)   Do you need help with shopping, housekeeping, medications, or finances?: Yes (shopping secondary to hip replacement and femur fracture)   Do you have rugs in hallways, broken steps, or poor lighting?: No   Do you have grab bars in your bathroom, non-slip strips in your tub, and hand rails on your stairs?: Yes   Cognitive Function Screen (1=Yes, 0=No)   What is you age?: Correct (77)   What is the time to the nearest hour?: Correct (4)   What is the year?: Correct (2024)   What is the name of this clinic?: Correct ([facility])   Can the patient recognize two persons (the doctor, the nurse, home help, etc.)?: Correct   What is the date of your birth? (day and month sufficient) : Correct (Mar 23, 1946)   In what year did World War II end?: Incorrect (1944)   Who is the current president of the [place]?: Correct (Biden)   Count from 20 down to 1?: Correct   What address did I give you  earlier?: Correct ([address]   Total  Score: 9   Interpretation of Total Score: Greater than 6 Normal   Fall Risk Screen   Do you feel unsteady when standing or walking?: No  Do you worry about falling?: Yes  Have you fallen in the past year?: No   Depression Screen     Little interest or pleasure in doing things.: Not at all  Feeling down, depressed, or hopeless: Not at all  PHQ 2 Total: 0     Pain Score   Pain Score:   0 - No pain    Substance Use-Abuse Screening     Tobacco Use     In Past 12 MONTHS, how often have you used any tobacco product (for example, cigarettes, e-cigarettes, cigars, pipes, or smokeless tobacco)?: Never     Alcohol use     In the PAST 12 MONTHS, how often have you had 5 (men)/4 (women) or more drinks containing alcohol in one day?: Never     Prescription Drug Use     In the PAST 12 months, how often have you used any prescription medications just for the feeling, more than prescribed, or that were not prescribed for you? Prescriptions may include: opioids, benzodiazepines, medications for ADHD: Never           Illicit Drug Use   In the PAST 12 MONTHS, how often have you used any drugs, including marijuana, cocaine or crack, heroin, methamphetamine, hallucinogens, ecstasy/MDMA?: Never        Hearing Screen   Have you noticed any hearing difficulties?: No  After whispering 9-1-6 how many numbers did the patient repeat correctly?: 3  After whispering 4-7-8 how many numbers did the patient repeat correctly?: 3       Vision Screen             Urine Incontinence Screen   Urinary Incontinence Screen  Do you ever leak urine when you don't want to?: No                     OBJECTIVE:   BP (!) 147/78 (Site: Right Arm, Patient Position: Sitting, Cuff Size: Adult)   Pulse 58   Temp 36.7 C (98.1 F) (Temporal)   Resp 16   Ht 1.651 m (5\' 5" )   Wt 47.2 kg (104 lb)   SpO2 99%   BMI 17.31 kg/m        Other appropriate exam:    Health Maintenance Due   Topic Date Due    Adult Tdap-Td (1 - Tdap)  Never done    Shingles Vaccine (2 of 2) 10/08/2018    Pneumococcal Vaccination, Age 27+ (2 of 2 - PCV) 08/14/2019    Medicare Annual Wellness Visit - Calendar Year Insurers  Never done    Covid-19 Vaccine (6 - 2024-25 season) 03/30/2023      ASSESSMENT & PLAN:  Problem List Items Addressed This Visit    None       Identified Risk Factors/ Recommended Actions     Fall Risk Follow up plan of care: Refer to PT to improve balance, strength, and gait training.  Vitamin D supplementation advised  Discussed optimizing home safety  Manage & monitor hypotension  Medications managed to minimize fall risk  Footwear and potential problems addressed  The PHQ 2 Total: 0 depression screen is interpreted as negative.              No orders of the defined types were placed in this encounter.  The patient has been educated about risk factors and recommended preventive care. Written Prevention Plan completed/ updated and given to patient (see After Visit Summary).    No follow-ups on file.    Mickey Farber, DO

## 2023-06-20 NOTE — Progress Notes (Signed)
FAMILY MEDICINE, MEDICAL OFFICE BUILDING  [address]  [place] [address]       Name: Angela Benson MRN:  [id number]   Date: 06/18/2023 Age: 77 y.o.          Provider: Mickey Farber, DO    Reason for visit: Follow Up 6 Months      History of Present Illness:  06/20/2023:  This 77 year old female returns for six-month follow-up to review her labs and get refills on all her medications.  She has lost 5 lb since she was here last.  She is recovering from a hairline fracture of the femur status post right hip replacement no pain now but no strength in his legs and hurts when she is standing she is using a rolling walker Dr. Apolinar Junes did not see her for this but [employer]my [employer]ours salaries did physical therapy has been deferred until fracture is healed insurance is approved 12 visits she should continue the aspirin to every other day she takes Tylenol for pain she can not raise up from a chair with the arms across her chest due to weakness in his lower extremities.  I reviewed with her the results of the last labs she had back in September.  Historical Data    Past Medical History:  Past Medical History:   Diagnosis Date    Acquired hypothyroidism     B-complex deficiency     Body mass index (BMI) of 19.9 or less in adult     Chronic neck pain     Dextroscoliosis     Essential hypertension     Irritable bowel syndrome     Low back pain     Midline low back pain without sciatica     Non-rheumatic tricuspid valve insufficiency     Osteopenia     Osteoporosis     Palpitations     Raynaud's syndrome     Retrolisthesis of vertebrae     Sensorineural hearing loss (SNHL) of right ear with unrestricted hearing of left ear     SVT (supraventricular tachycardia) (CMS HCC)     Urinary frequency          Past Surgical History:  Past Surgical History:   Procedure Laterality Date    COLONOSCOPY      HX APPENDECTOMY      HX HIP REPLACEMENT      HX HYSTERECTOMY           Allergies:  Allergies   Allergen Reactions    Nitrofurantoin  Hives/ Urticaria     Flu-like symptoms/hives    Penicillins Hives/ Urticaria     Hives/rash     Medications:  Current Outpatient Medications   Medication Sig    acetaminophen (TYLENOL) 500 mg Oral Tablet Take 1 Tablet (500 mg total) by mouth Every 4 hours as needed for Pain    aspirin (ECOTRIN) 81 mg Oral Tablet, Delayed Release (E.C.) Take 1 Tablet (81 mg total) by mouth Twice daily X 6 weeks    Ca carb-Ca gluc-Mg ox-Mg gluco (CALCIUM MAGNESIUM) 500 mg calcium -250 mg Oral Tablet Take 1 Tablet by mouth Once a day    cholecalciferol, vitamin D3, 50 mcg (2,000 unit) Oral Tablet Take 1 Tablet (2,000 Units total) by mouth Once a day    coenzyme Q10 100 mg Oral Capsule Take 1 Capsule (100 mg total) by mouth Once a day    cyanocobalamin (VITAMIN B 12) 1,000 mcg Oral Tablet Take 1 Tablet (1,000 mcg total) by mouth  Once a day    levothyroxine (SYNTHROID) 50 mcg Oral Tablet TAKE (1) TABLET DAILY    lisinopriL (PRINIVIL) 20 mg Oral Tablet TAKE (1) TABLET DAILY    ondansetron (ZOFRAN ODT) 4 mg Oral Tablet, Rapid Dissolve Take 1 Tablet (4 mg total) by mouth Every 8 hours as needed for Nausea/Vomiting     Family History:  Family Medical History:       Problem Relation (Age of Onset)    Arthritis-osteo Father    Breast Cancer Maternal Aunt    Congestive Heart Failure Mother    Dementia Mother    Diabetes type II Father    Elevated Lipids Father    Heart Attack Father    Hypertension (High Blood Pressure) Mother, Father    Melanoma Father    No Known Problems Sister, Brother, Maternal Grandmother, Maternal Grandfather, Paternal Grandmother, Paternal Grandfather, Daughter, Son, Maternal Uncle, Paternal Aunt, Paternal Uncle, Other            Social History:  Social History     Socioeconomic History    Marital status: Married   Tobacco Use    Smoking status: Never     Passive exposure: Never    Smokeless tobacco: Never   Vaping Use    Vaping status: Never Used   Substance and Sexual Activity    Alcohol use: Yes     Alcohol/week: 7.0  standard drinks of alcohol     Types: 7 Glasses of wine per week     Comment: Glass of wine every evening.    Drug use: Never     Social Determinants of Health     Transportation Needs: Low Risk  (05/02/2023)    Transportation Needs     SDOH Transportation: No   Social Connections: Low Risk  (05/02/2023)    Social Connections     SDOH Social Isolation: 5 or more times a week           Review of Systems:  Any pertinent Review of Systems as addressed in the HPI above.    Physical Exam:  Vital Signs:  Vitals:    06/18/23 1532   BP: (!) 147/78   Pulse: 58   Resp: 16   Temp: 36.7 C (98.1 F)   TempSrc: Temporal   SpO2: 99%   Weight: 47.2 kg (104 lb)   Height: 1.651 m (5\' 5" )   BMI: 17.31     Physical Exam  Vitals and nursing note reviewed.   Constitutional:       General: She is awake.      Appearance: Normal appearance. She is well-developed, well-groomed and underweight.   HENT:      Head: Normocephalic and atraumatic.      Right Ear: Tympanic membrane, ear canal and external ear normal.      Left Ear: Tympanic membrane, ear canal and external ear normal.      Nose: Nose normal.      Mouth/Throat:      Mouth: Mucous membranes are moist.      Pharynx: Oropharynx is clear.   Eyes:      Extraocular Movements: Extraocular movements intact.      Conjunctiva/sclera: Conjunctivae normal.      Pupils: Pupils are equal, round, and reactive to light.   Cardiovascular:      Rate and Rhythm: Normal rate and regular rhythm.      Pulses:           Carotid pulses are 2+ on the  right side and 2+ on the left side.       Radial pulses are 2+ on the right side and 2+ on the left side.        Dorsalis pedis pulses are 2+ on the right side and 2+ on the left side.        Posterior tibial pulses are 2+ on the right side and 2+ on the left side.      Heart sounds: Normal heart sounds, S1 normal and S2 normal.   Pulmonary:      Effort: Pulmonary effort is normal.      Breath sounds: Normal breath sounds.   Abdominal:      General: Abdomen is  flat and scaphoid. Bowel sounds are normal.      Palpations: Abdomen is soft.      Tenderness: There is no abdominal tenderness. There is no right CVA tenderness or left CVA tenderness.   Musculoskeletal:      Cervical back: Normal range of motion and neck supple.      Right hip: Tenderness and bony tenderness present. Decreased range of motion. Decreased strength.      Right lower leg: No edema.      Left lower leg: No edema.      Comments: Patient is unable to raise up from the chair without using her hands   Skin:     General: Skin is warm and dry.      Capillary Refill: Capillary refill takes less than 2 seconds.   Neurological:      General: No focal deficit present.      Mental Status: She is alert and oriented to person, place, and time. Mental status is at baseline.      Cranial Nerves: Cranial nerves 2-12 are intact.      Sensory: Sensation is intact.      Motor: Weakness present.      Coordination: Coordination abnormal.      Gait: Gait abnormal.      Comments: The patient continues to use a walker for ambulation   Psychiatric:         Mood and Affect: Mood normal.         Behavior: Behavior normal. Behavior is cooperative.         Thought Content: Thought content normal.         Judgment: Judgment normal.       Assessment:    ICD-10-CM    1. Femur fracture (CMS HCC)  S72.90XA       2. SVT (supraventricular tachycardia) (CMS HCC)  I47.10       3. Protein-calorie malnutrition, unspecified severity (CMS HCC)  E46       4. Essential hypertension  I10 LIPID PANEL     HEPATIC FUNCTION PANEL     CBC/DIFF     URINALYSIS, MACROSCOPIC AND MICROSCOPIC W/CULTURE REFLEX     BASIC METABOLIC PANEL     THYROID STIMULATING HORMONE (SENSITIVE TSH)      5. Acquired hypothyroidism  E03.9       6. Status post total replacement of right hip  Z96.641       7. Body mass index (BMI) of 19.9 or less in adult  Z68.1       8. Osteoporosis  M81.0       9. Right foot pain  M79.671 XR FOOT RIGHT      10. 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  The patient will continue to use her walker for her ambulation.  I recommend she increase her protein intake.  She should also stay on vitamin-D and calcium    Return in about 6 months (around 12/16/2023).    Mickey Farber, DO     Portions of this note may be dictated using voice recognition software or a dictation service. Variances in spelling and vocabulary are possible and unintentional. Not all errors are caught/corrected. Please notify the [facility] if any discrepancies are noted or if the meaning of any statement is not clear.

## 2023-07-01 ENCOUNTER — Ambulatory Visit (HOSPITAL_COMMUNITY): Payer: Self-pay

## 2023-09-12 ENCOUNTER — Other Ambulatory Visit (INDEPENDENT_AMBULATORY_CARE_PROVIDER_SITE_OTHER): Payer: Self-pay | Admitting: Family Medicine

## 2023-11-18 ENCOUNTER — Other Ambulatory Visit (INDEPENDENT_AMBULATORY_CARE_PROVIDER_SITE_OTHER): Payer: Self-pay | Admitting: Family Medicine

## 2023-12-10 ENCOUNTER — Other Ambulatory Visit (INDEPENDENT_AMBULATORY_CARE_PROVIDER_SITE_OTHER): Payer: Self-pay | Admitting: Family Medicine

## 2023-12-18 ENCOUNTER — Other Ambulatory Visit: Payer: Self-pay

## 2023-12-18 ENCOUNTER — Other Ambulatory Visit (INDEPENDENT_AMBULATORY_CARE_PROVIDER_SITE_OTHER): Payer: Self-pay | Admitting: Family Medicine

## 2023-12-18 ENCOUNTER — Encounter (INDEPENDENT_AMBULATORY_CARE_PROVIDER_SITE_OTHER): Payer: Self-pay | Admitting: Family Medicine

## 2023-12-18 ENCOUNTER — Ambulatory Visit: Payer: Self-pay | Attending: Family Medicine | Admitting: Family Medicine

## 2023-12-18 VITALS — BP 161/81 | HR 58 | Temp 97.6°F | Ht 65.0 in | Wt 103.0 lb

## 2023-12-18 DIAGNOSIS — M1990 Unspecified osteoarthritis, unspecified site: Secondary | ICD-10-CM | POA: Insufficient documentation

## 2023-12-18 DIAGNOSIS — S72144S Nondisplaced intertrochanteric fracture of right femur, sequela: Secondary | ICD-10-CM | POA: Insufficient documentation

## 2023-12-18 DIAGNOSIS — E039 Hypothyroidism, unspecified: Secondary | ICD-10-CM | POA: Insufficient documentation

## 2023-12-18 DIAGNOSIS — M810 Age-related osteoporosis without current pathological fracture: Secondary | ICD-10-CM | POA: Insufficient documentation

## 2023-12-18 DIAGNOSIS — M50123 Cervical disc disorder at C6-C7 level with radiculopathy: Secondary | ICD-10-CM | POA: Insufficient documentation

## 2023-12-18 DIAGNOSIS — I10 Essential (primary) hypertension: Secondary | ICD-10-CM | POA: Insufficient documentation

## 2023-12-18 DIAGNOSIS — I471 Supraventricular tachycardia, unspecified: Secondary | ICD-10-CM | POA: Insufficient documentation

## 2023-12-18 MED ORDER — FLUTICASONE PROPIONATE 50 MCG/ACTUATION NASAL SPRAY,SUSPENSION
1.0000 | Freq: Every day | NASAL | 1 refills | Status: DC
Start: 2023-12-18 — End: 2023-12-18

## 2023-12-18 MED ORDER — AZELASTINE 205.5 MCG (0.15 %) NASAL SPRAY
1.0000 | Freq: Two times a day (BID) | NASAL | 1 refills | Status: DC
Start: 2023-12-18 — End: 2024-04-27

## 2023-12-18 MED ORDER — AZELASTINE 205.5 MCG (0.15 %) NASAL SPRAY
1.0000 | Freq: Two times a day (BID) | NASAL | 1 refills | Status: DC
Start: 2023-12-18 — End: 2023-12-18

## 2023-12-18 MED ORDER — FLUTICASONE PROPIONATE 50 MCG/ACTUATION NASAL SPRAY,SUSPENSION
1.0000 | Freq: Every day | NASAL | 1 refills | Status: DC
Start: 2023-12-18 — End: 2024-04-23

## 2023-12-18 MED ORDER — TRIAMCINOLONE ACETONIDE 0.1 % DENTAL PASTE
1.0000 | PASTE | Freq: Every evening | DENTAL | 2 refills | Status: DC
Start: 2023-12-18 — End: 2024-04-27

## 2023-12-18 NOTE — Nursing Note (Signed)
 12/18/23 1345   Domestic Violence   Because we are aware of abuse and domestic violence today, we ask all patients: Are you being hurt, hit, or frightened by anyone at your home or in your life?  N   Basic Needs   Do you have any basic needs within your home that are not being met? (such as Food, Shelter, [employer] fast streamer, Tranportation, paying for bills and/or medications) N

## 2023-12-18 NOTE — Progress Notes (Signed)
 FAMILY MEDICINE, MEDICAL OFFICE BUILDING  [address]  [place] [address]  Operated by [facility]     Name: Angela Benson MRN:  [id number]   Date: 12/18/2023 Age: 78 y.o.          Provider: Jamison Mccreedy, DO    Reason for visit: Sinus Infection (Originally was being seen for spot in mouth, but states that has gotten better. Patient claims she has been dealing with what she thinks is a sinus infection/allergies since last Thursday. Says she is feeling better but wanted to come and be seen anyway. )      History of Present Illness:  12/18/2023:  This 78 year old female states that the left side of her mouth has been hurting since 05/15 it isn't getting any better but states her mouth has gotten better but believes she has a sinus infection now she has been feeling like this has since last Tuesday she states she is feeling a little bit better but still we are going to get checked.  He has had a 1 lb weight loss since she was here last.  She is also feeling like she has maybe a malar rash and wants to be checked for lupus.  Historical Data    Past Medical History:  Past Medical History:   Diagnosis Date    Acquired hypothyroidism     B-complex deficiency     Body mass index (BMI) of 19.9 or less in adult     Chronic neck pain     Dextroscoliosis     Essential hypertension     Irritable bowel syndrome     Low back pain     Midline low back pain without sciatica     Non-rheumatic tricuspid valve insufficiency     Osteopenia     Osteoporosis     Palpitations     Raynaud's syndrome     Retrolisthesis of vertebrae     Sensorineural hearing loss (SNHL) of right ear with unrestricted hearing of left ear     SVT (supraventricular tachycardia) (CMS HCC)     Urinary frequency          Past Surgical History:  Past Surgical History:   Procedure Laterality Date    COLONOSCOPY      HX APPENDECTOMY      HX HIP REPLACEMENT      HX HYSTERECTOMY           Allergies:  Allergies   Allergen Reactions     Nitrofurantoin Hives/ Urticaria     Flu-like symptoms/hives    Penicillins Hives/ Urticaria     Hives/rash     Medications:  Current Outpatient Medications   Medication Sig    acetaminophen  (TYLENOL ) 500 mg Oral Tablet Take 1 Tablet (500 mg total) by mouth Every 4 hours as needed for Pain    azelastine  (ASTEPRO ) 205.5 mcg (0.15 %) Nasal Spray, Non-Aerosol Administer 1 Spray into each nostril Twice daily Indications: non-seasonal allergic stuffy and runny nose, seasonal runny nose    Ca carb-Ca gluc-Mg ox-Mg gluco (CALCIUM MAGNESIUM) 500 mg calcium -250 mg Oral Tablet Take 1 Tablet by mouth Daily    cholecalciferol, vitamin D3, 50 mcg (2,000 unit) Oral Tablet Take 1 Tablet (2,000 Units total) by mouth Daily    coenzyme Q10 100 mg Oral Capsule Take 1 Capsule (100 mg total) by mouth Daily    cyanocobalamin (VITAMIN B 12) 1,000 mcg Oral Tablet Take 1 Tablet (1,000 mcg total) by mouth Daily  fluticasone  propionate (FLONASE ) 50 mcg/actuation Nasal Spray, Suspension Administer 1 Spray into each nostril Daily    levothyroxine  (SYNTHROID) 50 mcg Oral Tablet TAKE (1) TABLET DAILY    lisinopriL  (PRINIVIL ) 20 mg Oral Tablet TAKE (1) TABLET DAILY    ondansetron  (ZOFRAN  ODT) 4 mg Oral Tablet, Rapid Dissolve Take 1 Tablet (4 mg total) by mouth Every 8 hours as needed for Nausea/Vomiting    triamcinolone acetonide (KENALOG) 0.1 % Dental Paste 1 Application by Mucous Membrane route Every night Indications: stomatitis, a condition with painful swelling and sores inside the mouth     Family History:  Family Medical History:       Problem Relation (Age of Onset)    Arthritis-osteo Father    Breast Cancer Maternal Aunt    Congestive Heart Failure Mother    Dementia Mother    Diabetes type II Father    Elevated Lipids Father    Heart Attack Father    Hypertension (High Blood Pressure) Mother, Father    Melanoma Father    No Known Problems Sister, Brother, Maternal Grandmother, Maternal Grandfather, Paternal Grandmother, Paternal  Grandfather, Daughter, Son, Maternal Uncle, Paternal Aunt, Paternal Uncle, Other            Social History:  Social History     Socioeconomic History    Marital status: Married   Tobacco Use    Smoking status: Never     Passive exposure: Never    Smokeless tobacco: Never   Vaping Use    Vaping status: Never Used   Substance and Sexual Activity    Alcohol  use: Yes     Alcohol /week: 7.0 standard drinks of alcohol      Types: 7 Glasses of wine per week     Comment: Glass of wine every evening.    Drug use: Never     Social Determinants of Health     Transportation Needs: Low Risk  (05/02/2023)    Transportation Needs     SDOH Transportation: No   Social Connections: Low Risk  (05/02/2023)    Social Connections     SDOH Social Isolation: 5 or more times a week           Review of Systems:  Any pertinent Review of Systems as addressed in the HPI above.    Physical Exam:  Vital Signs:  Vitals:    12/18/23 1344 12/18/23 1346   BP: (!) 163/83 (!) 161/81   Pulse: 58    Temp: 36.4 C (97.6 F)    TempSrc: Temporal    SpO2: 99%    Weight: 46.7 kg (103 lb)    Height: 1.651 m (5\' 5" )    BMI: 17.14      Physical Exam  Vitals and nursing note reviewed.   Constitutional:       General: She is awake.      Appearance: Normal appearance. She is well-developed, well-groomed and underweight.   HENT:      Head: Normocephalic and atraumatic.      Right Ear: Hearing, tympanic membrane, ear canal and external ear normal. Tympanic membrane is not injected.      Left Ear: Hearing, tympanic membrane, ear canal and external ear normal. Tympanic membrane is not injected.      Nose: Congestion and rhinorrhea present.      Right Turbinates: Enlarged and swollen.      Left Turbinates: Enlarged and swollen.      Mouth/Throat:      Mouth: Mucous membranes are  moist.      Pharynx: Oropharynx is clear.      Comments: No evidence of oral lesions on gums or cheek on the right or left side  Eyes:      Extraocular Movements: Extraocular movements intact.       Conjunctiva/sclera: Conjunctivae normal.      Pupils: Pupils are equal, round, and reactive to light.   Cardiovascular:      Rate and Rhythm: Normal rate and regular rhythm.      Pulses:           Carotid pulses are 2+ on the right side and 2+ on the left side.       Radial pulses are 2+ on the right side and 2+ on the left side.        Dorsalis pedis pulses are 2+ on the right side and 2+ on the left side.        Posterior tibial pulses are 2+ on the right side and 2+ on the left side.      Heart sounds: Normal heart sounds, S1 normal and S2 normal.   Pulmonary:      Effort: Pulmonary effort is normal.      Breath sounds: Normal breath sounds.   Abdominal:      General: Abdomen is flat and scaphoid. Bowel sounds are normal.      Palpations: Abdomen is soft.      Tenderness: There is no abdominal tenderness. There is no right CVA tenderness or left CVA tenderness.   Musculoskeletal:         General: Normal range of motion.      Cervical back: Normal range of motion and neck supple.      Right lower leg: No edema.      Left lower leg: No edema.   Skin:     General: Skin is warm and dry.      Capillary Refill: Capillary refill takes less than 2 seconds.      Findings: No rash.      Comments: No redness of the face over the cheek bones   Neurological:      General: No focal deficit present.      Mental Status: She is alert and oriented to person, place, and time. Mental status is at baseline.      Cranial Nerves: Cranial nerves 2-12 are intact.      Sensory: Sensation is intact.      Motor: Motor function is intact.      Coordination: Coordination is intact.      Gait: Gait is intact.   Psychiatric:         Mood and Affect: Mood normal.         Behavior: Behavior normal. Behavior is cooperative.         Thought Content: Thought content normal.         Judgment: Judgment normal.       Assessment:    ICD-10-CM    1. SVT (supraventricular tachycardia) (CMS HCC)  I47.10       2. Acquired hypothyroidism  E03.9       3.  Essential hypertension  I10       4. Cervical disc disorder at C6-C7 level with radiculopathy  M50.123       5. Osteoporosis  M81.0       6. Arthritis  M19.90 HEP-2 SUBSTRATE ANTINUCLEAR ANTIBODIES (ANA), SERUM     SEDIMENTATION RATE     C-REACTIVE PROTEIN(CRP),INFLAMMATION  7. Closed nondisplaced intertrochanteric fracture of right femur, sequela  S72.144S          Plan:  Orders Placed This Encounter    HEP-2 SUBSTRATE ANTINUCLEAR ANTIBODIES (ANA), SERUM    SEDIMENTATION RATE    C-REACTIVE PROTEIN(CRP),INFLAMMATION    azelastine  (ASTEPRO ) 205.5 mcg (0.15 %) Nasal Spray, Non-Aerosol    fluticasone  propionate (FLONASE ) 50 mcg/actuation Nasal Spray, Suspension    triamcinolone acetonide (KENALOG) 0.1 % Dental Paste     Today I ordered a antinuclear antibody sedimentation rate and CRP .  She will get these labs done before she comes back in June.  I ordered Astepro  nasal spray refilled the Flonase  for her sinus congestion.  For her mouth sore if something comes up she will have Kenalog in [place] on hand in case she gets a mouth ulcer.  Her systolic blood pressure was slightly elevated but she states it is much better at home.  She needs to have an RSV vaccine and a Medicare wellness visit.    Return in about 1 month (around 01/20/2024).    Jamison Mccreedy, DO     Portions of this note may be dictated using voice recognition software or a dictation service. Variances in spelling and vocabulary are possible and unintentional. Not all errors are caught/corrected. Please notify the [facility] if any discrepancies are noted or if the meaning of any statement is not clear.

## 2024-01-09 ENCOUNTER — Other Ambulatory Visit (INDEPENDENT_AMBULATORY_CARE_PROVIDER_SITE_OTHER): Payer: Self-pay | Admitting: Family Medicine

## 2024-01-09 DIAGNOSIS — I10 Essential (primary) hypertension: Secondary | ICD-10-CM

## 2024-01-20 ENCOUNTER — Ambulatory Visit: Payer: Self-pay | Attending: OTOLARYNGOLOGY | Admitting: OTOLARYNGOLOGY

## 2024-01-20 ENCOUNTER — Other Ambulatory Visit: Payer: Self-pay

## 2024-01-20 ENCOUNTER — Ambulatory Visit (INDEPENDENT_AMBULATORY_CARE_PROVIDER_SITE_OTHER): Payer: Self-pay | Admitting: Family Medicine

## 2024-01-20 ENCOUNTER — Encounter (INDEPENDENT_AMBULATORY_CARE_PROVIDER_SITE_OTHER): Payer: Self-pay | Admitting: OTOLARYNGOLOGY

## 2024-01-20 VITALS — Ht 65.0 in | Wt 103.0 lb

## 2024-01-20 DIAGNOSIS — J309 Allergic rhinitis, unspecified: Secondary | ICD-10-CM | POA: Insufficient documentation

## 2024-01-20 DIAGNOSIS — R1310 Dysphagia, unspecified: Secondary | ICD-10-CM | POA: Insufficient documentation

## 2024-01-20 DIAGNOSIS — R09A2 Foreign body sensation, throat: Secondary | ICD-10-CM | POA: Insufficient documentation

## 2024-01-20 DIAGNOSIS — K219 Gastro-esophageal reflux disease without esophagitis: Secondary | ICD-10-CM | POA: Insufficient documentation

## 2024-01-20 DIAGNOSIS — R0982 Postnasal drip: Secondary | ICD-10-CM | POA: Insufficient documentation

## 2024-01-20 NOTE — Procedures (Signed)
 [facility]  [address]  [place] [address]  Operated by [facility]  Procedure Note    Name: Angela Benson MRN:  [id number]   Date: 01/20/2024 DOB:  1945-10-18 (78 y.o.)         [address]75 - LARYNGOSCOPY, FLEXIBLE DIAGNOSTIC (AMB ONLY)    Performed by: Margean Anes, DO  Authorized by: Margean Anes, DO    Time Out:     Immediately before the procedure, a time out was called:  Yes    Patient verified:  Yes    Procedure Verified:  Yes    Site Verified:  Yes  Documentation:      [facility]  [address]  [place] [address]  Operated by [facility]  Procedure Note    Name: Angela Benson MRN:  [id number]  Date: 01/20/2024 DOB:  10/04/1945 (78 y.o.)        @PROCDOC @    Indications for procedure: GERD / LPR management    Anesthesia: Oxymetazoline nasal spray    Description: The flexible endoscope was gently introduced into the nostril and passed along the floor of the nose to the nasopharynx. Adenoid was minimal and eustachian tubes normal. The retropalatal airway was patent.    The endoscope was passed to the oropharynx. Base of tongue displayed normal lingual tonsils, patent valelulla, and sharply defined upright epiglottis. Retrolingual airway was patent.    The larynx displayed normal true vocal cords with good mobility. False cords were normal. Arytenoid mucosa was pink with no edema.     The piriform recesses were symmetric without secretion. The hypopharynx was symmetric without lesion.    Findings: Laryngopharyngeal Reflux    The patient tolerated the procedure well.    Anes Margean, DO                 Andru Genter Diggins, DO

## 2024-01-20 NOTE — H&P (Signed)
 ENT, [facility]  [address]  [place] [address]  Operated by [facility]  Return Patient Visit    Name: Angela Benson MRN:  [id number]   Date: 01/20/2024 DOB: December 30, 1945 (78 y.o.)       Referring Provider:  Darra Na, DO    Reason for Visit:   Chief Complaint   Patient presents with    Throat Symptoms     States having PND and throat clearing. Continues taking famotidine .       History of Present Illness:  Angela Benson is a 78 y.o. female who is FU on throat, last seen 06/2022. She c/o mucus in her throat but cannot expectorate it, globus. She has slowly increased famotidine  from 20 mg to 30 mg daily for the last 4 months, and it has helped her sx's some. Denies dysphagia, throat pain, weight loss. Not bothered by allergies and not using nasal sprays. Dr. Marlyce did an EGD-- small hiatal hernia.       Patient History:  Problem List[1]    Current Outpatient Medications   Medication Sig    acetaminophen  (TYLENOL ) 500 mg Oral Tablet Take 1 Tablet (500 mg total) by mouth Every 4 hours as needed for Pain    azelastine  (ASTEPRO ) 205.5 mcg (0.15 %) Nasal Spray, Non-Aerosol Administer 1 Spray into each nostril Twice daily Indications: non-seasonal allergic stuffy and runny nose, seasonal runny nose    Ca carb-Ca gluc-Mg ox-Mg gluco (CALCIUM MAGNESIUM) 500 mg calcium -250 mg Oral Tablet Take 1 Tablet by mouth Daily    cholecalciferol, vitamin D3, 50 mcg (2,000 unit) Oral Tablet Take 1 Tablet (2,000 Units total) by mouth Daily    coenzyme Q10 100 mg Oral Capsule Take 1 Capsule (100 mg total) by mouth Daily    cyanocobalamin (VITAMIN B 12) 1,000 mcg Oral Tablet Take 1 Tablet (1,000 mcg total) by mouth Daily    fluticasone  propionate (FLONASE ) 50 mcg/actuation Nasal Spray, Suspension Administer 1 Spray into each nostril Daily    levothyroxine  (SYNTHROID) 50 mcg Oral Tablet TAKE (1) TABLET DAILY    lisinopriL  (PRINIVIL ) 20 mg Oral Tablet TAKE (1) TABLET DAILY    ondansetron  (ZOFRAN   ODT) 4 mg Oral Tablet, Rapid Dissolve Take 1 Tablet (4 mg total) by mouth Every 8 hours as needed for Nausea/Vomiting    triamcinolone  acetonide (KENALOG ) 0.1 % Dental Paste 1 Application by Mucous Membrane route Every night Indications: stomatitis, a condition with painful swelling and sores inside the mouth      Allergies[2]  Past Medical History:   Diagnosis Date    Acquired hypothyroidism     B-complex deficiency     Body mass index (BMI) of 19.9 or less in adult     Chronic neck pain     Dextroscoliosis     Essential hypertension     Irritable bowel syndrome     Low back pain     Midline low back pain without sciatica     Non-rheumatic tricuspid valve insufficiency     Osteopenia     Osteoporosis     Palpitations     Raynaud's syndrome     Retrolisthesis of vertebrae     Sensorineural hearing loss (SNHL) of right ear with unrestricted hearing of left ear     SVT (supraventricular tachycardia) (CMS HCC)     Urinary frequency       Past Surgical History:   Procedure Laterality Date    COLONOSCOPY  HX APPENDECTOMY      HX HIP REPLACEMENT      HX HYSTERECTOMY        Family Medical History:       Problem Relation (Age of Onset)    Arthritis-osteo Father    Breast Cancer Maternal Aunt    Congestive Heart Failure Mother    Dementia Mother    Diabetes type II Father    Elevated Lipids Father    Heart Attack Father    Hypertension (High Blood Pressure) Mother, Father    Melanoma Father    No Known Problems Sister, Brother, Maternal Grandmother, Maternal Grandfather, Paternal Grandmother, Paternal Grandfather, Daughter, Son, Maternal Uncle, Paternal Aunt, Paternal Uncle, Other            Social History[3]    Review of Systems:  Review of Systems    Physical Exam:  Ht 1.651 m (5' 5)   Wt 46.7 kg (103 lb)   BMI 17.14 kg/m       ENT Physical Exam  Constitutional  Appearance: patient appears well-developed, well-nourished and well-groomed,  Communication/Voice: communication appropriate for developmental age; vocal  quality normal;  Head and Face  Appearance: head appears normal, face appears normal and face appears atraumatic;  Palpation: facial palpation normal;  Salivary: glands normal;  Ear  Hearing: intact;  Auricles: right auricle normal; left auricle normal;  External Mastoids: right external mastoid normal; left external mastoid normal;  Ear Canals: right ear canal normal; left ear canal normal;  Tympanic Membranes: right tympanic membrane normal; left tympanic membrane normal;  Nose  External Nose: nares patent bilaterally; external nose normal;  Internal Nose: nasal mucosa normal; septum normal; bilateral inferior turbinates normal;  Oral Cavity/Oropharynx  Lips: normal;  Teeth: normal;  Gums: gingiva normal;  Tongue: normal;  Oral mucosa: normal;  Hard palate: normal;  Neck  Neck: neck normal; neck palpation normal;  Thyroid : thyroid  normal;  Respiratory  Inspection: breathing unlabored; normal breathing rate;  Lymphatic  Palpation: lymph nodes normal;  Neurovestibular  Mental Status: alert and oriented;  Psychiatric: mood normal; affect is appropriate;  Cranial Nerves: cranial nerves intact;         Assessment:  ENCOUNTER DIAGNOSES     ICD-10-CM   1. Globus sensation  R09.A2   2. Dysphagia, unspecified type  R13.10   3. 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 Post-nasal drainage  R09.82   5. Chronic allergic rhinitis  J30.9       Plan:  Medical records reviewed on 01/20/2024.  Recommend  40 mg Pepcid  qhs. Discussed lisinopril  holiday and patient will contact PCP. Discussed antireflux precautions.     Orders Placed This Encounter    31575 - LARYNGOSCOPY, FLEXIBLE DIAGNOSTIC (AMB ONLY)      Return in about 3 months (around 04/21/2024).    Barnie SHAUNNA Alvine, PA-C  The advanced practice clinician's documentation was reviewed/amended in its entirety with the assessment and plan portion completely performed independently by me during this separate encounter.          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    Impingement syndrome of left shoulder    Neck pain of over 3 months duration    Acquired hypothyroidism    Essential hypertension    Osteoporosis    SVT (supraventricular tachycardia) (CMS HCC)    Midline low  back pain without sciatica    Body mass index (BMI) of 19.9 or less in adult    Breast cancer screening by mammogram    Protein-calorie malnutrition, unspecified severity (CMS HCC)    Status post THR (total hip replacement)    Femur fracture    Right foot pain    Arthritis   [2]   Allergies  Allergen Reactions    Nitrofurantoin Hives/ Urticaria     Flu-like symptoms/hives    Penicillins Hives/ Urticaria     Hives/rash   [3]   Social History  Tobacco Use    Smoking status: Never     Passive exposure: Never    Smokeless tobacco: Never   Vaping Use    Vaping status: Never Used   Substance Use Topics    Alcohol  use: Yes     Alcohol /week: 7.0 standard drinks of alcohol      Types: 7 Glasses of wine per week     Comment: Glass of wine every evening.    Drug use: Never

## 2024-02-17 ENCOUNTER — Other Ambulatory Visit (INDEPENDENT_AMBULATORY_CARE_PROVIDER_SITE_OTHER): Payer: Self-pay | Admitting: Family Medicine

## 2024-03-13 ENCOUNTER — Other Ambulatory Visit (INDEPENDENT_AMBULATORY_CARE_PROVIDER_SITE_OTHER): Payer: Self-pay | Admitting: Family Medicine

## 2024-03-17 ENCOUNTER — Other Ambulatory Visit (INDEPENDENT_AMBULATORY_CARE_PROVIDER_SITE_OTHER): Payer: Self-pay | Admitting: Family Medicine

## 2024-03-17 ENCOUNTER — Other Ambulatory Visit: Payer: Self-pay

## 2024-03-17 ENCOUNTER — Ambulatory Visit (INDEPENDENT_AMBULATORY_CARE_PROVIDER_SITE_OTHER): Payer: Self-pay

## 2024-03-17 ENCOUNTER — Ambulatory Visit: Attending: Family Medicine

## 2024-03-17 DIAGNOSIS — R399 Unspecified symptoms and signs involving the genitourinary system: Secondary | ICD-10-CM | POA: Insufficient documentation

## 2024-03-17 LAB — URINALYSIS, MACROSCOPIC
BILIRUBIN: NEGATIVE mg/dL
BLOOD: NEGATIVE mg/dL
GLUCOSE: NEGATIVE mg/dL
KETONES: NEGATIVE mg/dL
LEUKOCYTES: NEGATIVE WBCs/uL
NITRITE: NEGATIVE
PH: 6 (ref 5.0–9.0)
PROTEIN: NEGATIVE mg/dL
SPECIFIC GRAVITY: 1.017 (ref 1.002–1.030)
UROBILINOGEN: NORMAL mg/dL

## 2024-03-17 LAB — URINALYSIS, MICROSCOPIC
RBCS: <1 /HPF (ref <4)
SQUAMOUS EPITHELIAL: <1 /HPF (ref <28)
WBCS: 1 /HPF (ref <6)

## 2024-04-06 ENCOUNTER — Other Ambulatory Visit (INDEPENDENT_AMBULATORY_CARE_PROVIDER_SITE_OTHER): Payer: Self-pay | Admitting: Family Medicine

## 2024-04-06 DIAGNOSIS — E559 Vitamin D deficiency, unspecified: Secondary | ICD-10-CM

## 2024-04-06 DIAGNOSIS — Z131 Encounter for screening for diabetes mellitus: Secondary | ICD-10-CM

## 2024-04-06 DIAGNOSIS — R5383 Other fatigue: Secondary | ICD-10-CM

## 2024-04-06 DIAGNOSIS — R748 Abnormal levels of other serum enzymes: Secondary | ICD-10-CM

## 2024-04-06 DIAGNOSIS — E039 Hypothyroidism, unspecified: Secondary | ICD-10-CM

## 2024-04-06 DIAGNOSIS — R399 Unspecified symptoms and signs involving the genitourinary system: Secondary | ICD-10-CM

## 2024-04-06 DIAGNOSIS — E538 Deficiency of other specified B group vitamins: Secondary | ICD-10-CM

## 2024-04-06 DIAGNOSIS — Z13220 Encounter for screening for lipoid disorders: Secondary | ICD-10-CM

## 2024-04-06 DIAGNOSIS — I10 Essential (primary) hypertension: Secondary | ICD-10-CM

## 2024-04-06 DIAGNOSIS — M810 Age-related osteoporosis without current pathological fracture: Secondary | ICD-10-CM

## 2024-04-23 ENCOUNTER — Ambulatory Visit: Attending: Family Medicine

## 2024-04-23 ENCOUNTER — Other Ambulatory Visit (INDEPENDENT_AMBULATORY_CARE_PROVIDER_SITE_OTHER): Payer: Self-pay | Admitting: Family Medicine

## 2024-04-23 ENCOUNTER — Other Ambulatory Visit: Payer: Self-pay

## 2024-04-23 DIAGNOSIS — R399 Unspecified symptoms and signs involving the genitourinary system: Secondary | ICD-10-CM | POA: Insufficient documentation

## 2024-04-23 DIAGNOSIS — E538 Deficiency of other specified B group vitamins: Secondary | ICD-10-CM | POA: Insufficient documentation

## 2024-04-23 DIAGNOSIS — M810 Age-related osteoporosis without current pathological fracture: Secondary | ICD-10-CM | POA: Insufficient documentation

## 2024-04-23 DIAGNOSIS — E559 Vitamin D deficiency, unspecified: Secondary | ICD-10-CM | POA: Insufficient documentation

## 2024-04-23 DIAGNOSIS — I10 Essential (primary) hypertension: Secondary | ICD-10-CM | POA: Insufficient documentation

## 2024-04-23 DIAGNOSIS — R5383 Other fatigue: Secondary | ICD-10-CM | POA: Insufficient documentation

## 2024-04-23 DIAGNOSIS — Z131 Encounter for screening for diabetes mellitus: Secondary | ICD-10-CM | POA: Insufficient documentation

## 2024-04-23 DIAGNOSIS — Z13220 Encounter for screening for lipoid disorders: Secondary | ICD-10-CM | POA: Insufficient documentation

## 2024-04-23 DIAGNOSIS — R748 Abnormal levels of other serum enzymes: Secondary | ICD-10-CM | POA: Insufficient documentation

## 2024-04-23 DIAGNOSIS — E039 Hypothyroidism, unspecified: Secondary | ICD-10-CM | POA: Insufficient documentation

## 2024-04-23 LAB — CBC WITH DIFF
BASOPHIL #: 0.1 x10ˆ3/uL (ref 0.00–0.10)
BASOPHIL %: 1 % (ref 0–1)
EOSINOPHIL #: 0.2 x10ˆ3/uL (ref 0.00–0.50)
EOSINOPHIL %: 4 % (ref 1–7)
HCT: 42.4 % — ABNORMAL HIGH (ref 31.2–41.9)
HGB: 14.6 g/dL — ABNORMAL HIGH (ref 10.9–14.3)
LYMPHOCYTE #: 1.5 x10ˆ3/uL (ref 1.10–3.10)
LYMPHOCYTE %: 29 % (ref 16–46)
MCH: 32.1 pg (ref 24.7–32.8)
MCHC: 34.3 g/dL (ref 32.3–35.6)
MCV: 93.3 fL (ref 75.5–95.3)
MONOCYTE #: 0.5 x10ˆ3/uL (ref 0.20–0.90)
MONOCYTE %: 10 % (ref 4–11)
MPV: 8 fL (ref 7.9–10.8)
NEUTROPHIL #: 2.9 x10ˆ3/uL (ref 1.90–8.20)
NEUTROPHIL %: 56 % (ref 43–77)
PLATELETS: 269 x10ˆ3/uL (ref 140–440)
RBC: 4.54 x10ˆ6/uL (ref 3.63–4.92)
RDW: 13.4 % (ref 12.3–17.7)
WBC: 5.2 x10ˆ3/uL (ref 3.8–11.8)

## 2024-04-23 LAB — THYROID STIMULATING HORMONE (SENSITIVE TSH): TSH: 2.386 u[IU]/mL (ref 0.450–5.330)

## 2024-04-23 LAB — URINALYSIS, MACROSCOPIC
BILIRUBIN: NEGATIVE mg/dL
BLOOD: NEGATIVE mg/dL
GLUCOSE: NEGATIVE mg/dL
KETONES: NEGATIVE mg/dL
LEUKOCYTES: 25 WBCs/uL — AB
NITRITE: NEGATIVE
PH: 8 (ref 5.0–9.0)
PROTEIN: NEGATIVE mg/dL
SPECIFIC GRAVITY: 1.013 (ref 1.002–1.030)
UROBILINOGEN: NORMAL mg/dL

## 2024-04-23 LAB — LIPID PANEL
CHOL/HDL RATIO: 2.3
CHOLESTEROL: 183 mg/dL (ref <200)
HDL CHOL: 81 mg/dL (ref >=40)
LDL CALC: 85 mg/dL (ref 0–100)
TRIGLYCERIDES: 86 mg/dL (ref <=150)
VLDL CALC: 17 mg/dL (ref 0–50)

## 2024-04-23 LAB — BASIC METABOLIC PANEL
ANION GAP: 7 mmol/L (ref 4–13)
BUN/CREA RATIO: 24 — ABNORMAL HIGH (ref 6–22)
BUN: 20 mg/dL (ref 7–25)
CALCIUM: 9.8 mg/dL (ref 8.6–10.3)
CHLORIDE: 105 mmol/L (ref 98–107)
CO2 TOTAL: 29 mmol/L (ref 21–31)
CREATININE: 0.83 mg/dL (ref 0.60–1.30)
ESTIMATED GFR: 73 mL/min/1.73mˆ2 (ref >59)
GLUCOSE: 98 mg/dL (ref 74–109)
OSMOLALITY, CALCULATED: 284 mosm/kg (ref 270–290)
POTASSIUM: 4.5 mmol/L (ref 3.5–5.1)
SODIUM: 141 mmol/L (ref 136–145)

## 2024-04-23 LAB — HEPATIC FUNCTION PANEL
ALBUMIN/GLOBULIN RATIO: 1.4 (ref 0.8–1.4)
ALBUMIN: 4.3 g/dL (ref 3.5–5.7)
ALKALINE PHOSPHATASE: 94 U/L (ref 34–104)
ALT (SGPT): 18 U/L (ref 7–52)
AST (SGOT): 30 U/L (ref 13–39)
BILIRUBIN DIRECT: 0.2 md/dL — ABNORMAL HIGH (ref 0.03–0.18)
BILIRUBIN TOTAL: 1.3 mg/dL — ABNORMAL HIGH (ref 0.3–1.0)
BILIRUBIN, INDIRECT: 1.1 mg/dL — ABNORMAL HIGH (ref <=1)
GLOBULIN: 3 (ref 2.0–3.5)
PROTEIN TOTAL: 7.3 g/dL (ref 6.4–8.9)

## 2024-04-23 LAB — URINALYSIS, MICROSCOPIC
RBCS: 1 /HPF (ref <4)
SQUAMOUS EPITHELIAL: <1 /HPF (ref <28)
WBCS: 2 /HPF (ref <6)

## 2024-04-23 LAB — VITAMIN D 25 TOTAL: VITAMIN D 25, TOTAL: 91.9 ng/mL (ref 30.00–100.00)

## 2024-04-23 LAB — VITAMIN B12: VITAMIN B 12: 281 pg/mL (ref 180–914)

## 2024-04-23 LAB — HGA1C (HEMOGLOBIN A1C WITH EST AVG GLUCOSE): HEMOGLOBIN A1C: 5.6 % (ref 4.0–6.0)

## 2024-04-26 ENCOUNTER — Ambulatory Visit (INDEPENDENT_AMBULATORY_CARE_PROVIDER_SITE_OTHER): Payer: Self-pay | Admitting: Family Medicine

## 2024-04-27 ENCOUNTER — Ambulatory Visit: Payer: Self-pay | Attending: Family Medicine | Admitting: Family Medicine

## 2024-04-27 ENCOUNTER — Encounter (INDEPENDENT_AMBULATORY_CARE_PROVIDER_SITE_OTHER): Payer: Self-pay | Admitting: Family Medicine

## 2024-04-27 ENCOUNTER — Other Ambulatory Visit: Payer: Self-pay

## 2024-04-27 DIAGNOSIS — M8580 Other specified disorders of bone density and structure, unspecified site: Secondary | ICD-10-CM | POA: Insufficient documentation

## 2024-04-27 DIAGNOSIS — M50123 Cervical disc disorder at C6-C7 level with radiculopathy: Secondary | ICD-10-CM | POA: Insufficient documentation

## 2024-04-27 DIAGNOSIS — Z1231 Encounter for screening mammogram for malignant neoplasm of breast: Secondary | ICD-10-CM | POA: Insufficient documentation

## 2024-04-27 DIAGNOSIS — M542 Cervicalgia: Secondary | ICD-10-CM | POA: Insufficient documentation

## 2024-04-27 DIAGNOSIS — I471 Supraventricular tachycardia, unspecified: Secondary | ICD-10-CM | POA: Insufficient documentation

## 2024-04-27 DIAGNOSIS — E039 Hypothyroidism, unspecified: Secondary | ICD-10-CM | POA: Insufficient documentation

## 2024-04-27 DIAGNOSIS — Z23 Encounter for immunization: Secondary | ICD-10-CM | POA: Insufficient documentation

## 2024-04-27 DIAGNOSIS — I10 Essential (primary) hypertension: Secondary | ICD-10-CM | POA: Insufficient documentation

## 2024-04-27 MED ORDER — LEVOTHYROXINE 50 MCG TABLET
50.0000 ug | ORAL_TABLET | Freq: Every day | ORAL | 0 refills | Status: DC
Start: 2024-04-27 — End: 2024-06-16

## 2024-04-27 MED ORDER — LISINOPRIL 20 MG TABLET
20.0000 mg | ORAL_TABLET | Freq: Every day | ORAL | 0 refills | Status: DC
Start: 2024-04-27 — End: 2024-06-11

## 2024-04-27 MED ORDER — LOSARTAN 25 MG TABLET
25.0000 mg | ORAL_TABLET | Freq: Every day | ORAL | 4 refills | Status: DC
Start: 2024-04-27 — End: 2024-06-18

## 2024-04-27 MED ORDER — LOSARTAN 25 MG TABLET
25.0000 mg | ORAL_TABLET | Freq: Every day | ORAL | 4 refills | Status: DC
Start: 2024-04-27 — End: 2024-04-27

## 2024-04-27 NOTE — Progress Notes (Unsigned)
 [facility]  [address]  [place] [address]  Operated by [facility]     Name: Angela Benson MRN:  [id number]   Date: 04/27/2024 Age: 78 y.o.          Provider: Layman Law, DO    Reason for visit: Follow Up 6 Months (Patient's last CDM 06/18/23.)      History of Present Illness:  04/27/2024:  This 78 year old female returns for six-month follow-up last being seen by me on November 24 for CDM.  She did see Dr. Teresa cell recently had endoscopy done which revealed laryngeal pharyngeal reflux.  Her blood pressure was elevated there but she states it is much better at home.  Her index finger on right hand turns blue she has a history of Raynaud's phenomenon in his stays that way for several days she stopped the famotidine  because it caused diarrhea Gaviscon caused diarrhea also she occasionally wakes up with a headache and sore throat every 4-6 weeks with in his gone by the end of the day she needs a flu shot today and an RSV vaccine and a month.  I need to refill her levothyroxine  today and I am going to change her to losartan  to see if that will help with the Raynaud's better than the lisinopril -because I do not think the patient would tolerate nifedipine.  At this time she denies nausea vomiting diarrhea constipation syncope near-syncope chest pain or shortness for breath.  Historical Data    Past Medical History:  Past Medical History:   Diagnosis Date    Acquired hypothyroidism     B-complex deficiency     Body mass index (BMI) of 19.9 or less in adult     Chronic neck pain     Dextroscoliosis     Essential hypertension     Irritable bowel syndrome     Low back pain     Midline low back pain without sciatica     Non-rheumatic tricuspid valve insufficiency     Osteopenia     Osteoporosis     Palpitations     Raynaud's syndrome     Retrolisthesis of vertebrae     Sensorineural hearing loss (SNHL) of right ear with unrestricted hearing of left ear      SVT (supraventricular tachycardia) (CMS HCC)     Urinary frequency          Past Surgical History:  Past Surgical History:   Procedure Laterality Date    COLONOSCOPY      HX APPENDECTOMY      HX HIP REPLACEMENT      HX HYSTERECTOMY           Allergies:  Allergies[1]  Medications:  Current Outpatient Medications   Medication Sig    acetaminophen  (TYLENOL ) 500 mg Oral Tablet Take 1 Tablet (500 mg total) by mouth Every 4 hours as needed for Pain    calcium citrate-vitamin D3 (CITRACAL) 200 mg-6.25 mcg (250 unit) Oral Tablet Take 1 Tablet by mouth Daily    cholecalciferol, vitamin D3, 50 mcg (2,000 unit) Oral Tablet Take 1 Tablet (2,000 Units total) by mouth Daily    coenzyme Q10 100 mg Oral Capsule Take 1 Capsule (100 mg total) by mouth Daily    levothyroxine  (SYNTHROID) 50 mcg Oral Tablet Take 1 Tablet (50 mcg total) by mouth Daily    lisinopriL  (PRINIVIL ) 20 mg Oral Tablet Take 1 Tablet (20 mg total) by mouth Daily    losartan  (  COZAAR ) 25 mg Oral Tablet Take 1 Tablet (25 mg total) by mouth Daily    ondansetron  (ZOFRAN  ODT) 4 mg Oral Tablet, Rapid Dissolve Take 1 Tablet (4 mg total) by mouth Every 8 hours as needed for Nausea/Vomiting     Family History:  Family Medical History:       Problem Relation (Age of Onset)    Arthritis-osteo Father    Breast Cancer Maternal Aunt    Congestive Heart Failure Mother    Dementia Mother    Diabetes type II Father    Elevated Lipids Father    Heart Attack Father    Hypertension (High Blood Pressure) Mother, Father    Melanoma Father    No Known Problems Sister, Brother, Maternal Grandmother, Maternal Grandfather, Paternal Grandmother, Paternal Grandfather, Daughter, Son, Maternal Uncle, Paternal Aunt, Paternal Uncle, Other            Social History:  Social History     Socioeconomic History    Marital status: Married   Tobacco Use    Smoking status: Never     Passive exposure: Never    Smokeless tobacco: Never   Vaping Use    Vaping status: Never Used   Substance and Sexual  Activity    Alcohol  use: Yes     Alcohol /week: 7.0 standard drinks of alcohol      Types: 7 Glasses of wine per week     Comment: Glass of wine every evening.    Drug use: Never     Social Determinants of Health     Transportation Needs: Low Risk (05/02/2023)    Transportation Needs     SDOH Transportation: No   Social Connections: Low Risk (05/02/2023)    Social Connections     SDOH Social Isolation: 5 or more times a week           Review of Systems:  Any pertinent Review of Systems as addressed in the HPI above.    Physical Exam:  Vital Signs:  Vitals:    04/27/24 1706   BP: (!) 159/82   Pulse: 61   Resp: 18   Temp: 36.8 C (98.3 F)   TempSrc: Temporal   SpO2: 100%   Weight: 46.7 kg (103 lb)   Height: 1.651 m (5' 5)   BMI: 17.14     Physical Exam  Vitals and nursing note reviewed.   Constitutional:       General: She is awake.      Appearance: Normal appearance. She is well-developed, well-groomed and underweight.   HENT:      Head: Normocephalic and atraumatic.      Right Ear: Tympanic membrane, ear canal and external ear normal.      Left Ear: Tympanic membrane, ear canal and external ear normal.      Nose: Nose normal.      Mouth/Throat:      Mouth: Mucous membranes are moist.      Pharynx: Oropharynx is clear.   Eyes:      Extraocular Movements: Extraocular movements intact.      Conjunctiva/sclera: Conjunctivae normal.      Pupils: Pupils are equal, round, and reactive to light.   Cardiovascular:      Rate and Rhythm: Normal rate and regular rhythm.      Pulses:           Carotid pulses are 2+ on the right side and 2+ on the left side.       Radial pulses are  2+ on the right side and 2+ on the left side.        Dorsalis pedis pulses are 2+ on the right side and 2+ on the left side.        Posterior tibial pulses are 2+ on the right side and 2+ on the left side.      Heart sounds: Normal heart sounds, S1 normal and S2 normal.   Pulmonary:      Effort: Pulmonary effort is normal.      Breath sounds: Normal  breath sounds. No decreased breath sounds, wheezing, rhonchi or rales.   Abdominal:      General: Abdomen is flat and scaphoid. Bowel sounds are normal.      Palpations: Abdomen is soft.      Tenderness: There is no abdominal tenderness. There is no right CVA tenderness or left CVA tenderness.   Musculoskeletal:         General: Normal range of motion.      Cervical back: Normal range of motion and neck supple.      Right lower leg: No edema.      Left lower leg: No edema.   Skin:     General: Skin is warm and dry.      Capillary Refill: Capillary refill takes less than 2 seconds.   Neurological:      General: No focal deficit present.      Mental Status: She is alert and oriented to person, place, and time. Mental status is at baseline.      Cranial Nerves: Cranial nerves 2-12 are intact.      Sensory: Sensation is intact.      Motor: Motor function is intact.      Coordination: Coordination is intact.      Gait: Gait is intact.   Psychiatric:         Mood and Affect: Mood normal.         Behavior: Behavior normal. Behavior is cooperative.         Thought Content: Thought content normal.         Judgment: Judgment normal.       Assessment:    ICD-10-CM    1. SVT (supraventricular tachycardia) (CMS HCC)  I47.10       2. Osteopenia, unspecified location  M85.80 BONE DENSITOMETRY COMPARISON      3. Breast cancer screening by mammogram  Z12.31 MAMMO BILATERAL SCREENING      4. Need for immunization against influenza  Z23 Flu Vaccine, 65+,0.5 mL IM (Admin)      5. Essential hypertension  I10 LIPID PANEL     HEPATIC FUNCTION PANEL     CBC/DIFF     URINALYSIS, MACROSCOPIC AND MICROSCOPIC W/CULTURE REFLEX     BASIC METABOLIC PANEL     THYROID  STIMULATING HORMONE (SENSITIVE TSH)      6. 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 Acquired hypothyroidism  E03.9       8. Neck pain of over 3 months duration  M54.2          Plan:  Orders Placed This Encounter    BONE DENSITOMETRY COMPARISON     MAMMO BILATERAL SCREENING    Flu Vaccine, 65+,0.5 mL IM (Admin)    LIPID PANEL    HEPATIC FUNCTION PANEL    CBC/DIFF    URINALYSIS, MACROSCOPIC AND MICROSCOPIC W/CULTURE REFLEX    BASIC METABOLIC PANEL  THYROID  STIMULATING HORMONE (SENSITIVE TSH)    lisinopriL  (PRINIVIL ) 20 mg Oral Tablet    levothyroxine  (SYNTHROID) 50 mcg Oral Tablet    losartan  (COZAAR ) 25 mg Oral Tablet     We are going to change the patient to losartan  from lisinopril  we sent that has [facility].  I refilled her Synthroid.  Also ordered a bone density and a mammogram she will get a flu shot today.  For 6 months from now ordered a lipid panel hepatic function panel CBC with diff urinalysis basic metabolic panel thyroid -stimulating hormone.  The patient may benefit from taking a Tums at bedtime.  She should stay on a heart healthy low-fat low-cholesterol diet and avoid high fructose corn syrup and concentrated sweets.    Return in about 6 months (around 10/25/2024).    Layman Law, DO     Portions of this note may be dictated using voice recognition software or a dictation service. Variances in spelling and vocabulary are possible and unintentional. Not all errors are caught/corrected. Please notify the dino if any discrepancies are noted or if the meaning of any statement is not clear.        [1]   Allergies  Allergen Reactions    Nitrofurantoin Hives/ Urticaria     Flu-like symptoms/hives    Penicillins Hives/ Urticaria     Hives/rash

## 2024-04-27 NOTE — Nursing Note (Signed)
 04/27/24 1706   Depression Screen   Little interest or pleasure in doing things. 0   Feeling down, depressed, or hopeless 0   PHQ 2 Total 0

## 2024-04-27 NOTE — Nursing Note (Signed)
 04/27/24 1706   Recent Weight Change   Have you had a recent unexplained weight loss or gain? N   Domestic Violence   Because we are aware of abuse and domestic violence today, we ask all patients: Are you being hurt, hit, or frightened by anyone at your home or in your life?  N   Basic Needs   Do you have any basic needs within your home that are not being met? (such as Food, Shelter, [employer] fast streamer, Tranportation, paying for bills and/or medications) N

## 2024-05-20 ENCOUNTER — Encounter (INDEPENDENT_AMBULATORY_CARE_PROVIDER_SITE_OTHER): Payer: Self-pay | Admitting: OTOLARYNGOLOGY

## 2024-05-20 ENCOUNTER — Ambulatory Visit: Payer: Self-pay | Attending: OTOLARYNGOLOGY | Admitting: OTOLARYNGOLOGY

## 2024-05-20 ENCOUNTER — Other Ambulatory Visit: Payer: Self-pay

## 2024-05-20 VITALS — Ht 65.0 in | Wt 103.0 lb

## 2024-05-20 DIAGNOSIS — K219 Gastro-esophageal reflux disease without esophagitis: Secondary | ICD-10-CM | POA: Insufficient documentation

## 2024-05-20 DIAGNOSIS — R1310 Dysphagia, unspecified: Secondary | ICD-10-CM | POA: Insufficient documentation

## 2024-05-20 DIAGNOSIS — R09A2 Foreign body sensation, throat: Secondary | ICD-10-CM | POA: Insufficient documentation

## 2024-05-20 DIAGNOSIS — J309 Allergic rhinitis, unspecified: Secondary | ICD-10-CM | POA: Insufficient documentation

## 2024-05-20 NOTE — H&P (Signed)
 ENT, [facility]  [address]  [place] [address]  Operated by [facility]  Progress Note    Name: Angela Benson MRN:  [id number]   Date: 05/20/2024 DOB:  1945-10-21 (78 y.o.)              Follow Up      Subjective:   Chief Complaint:   Follow Up 4 Months (LOV 01/20/2024 Globus sensation /Pt is getting better no complaints )       History of Present Illness:  REGENIA ERCK is a 77 y.o. old female who presents to the clinic for follow-up. Patient states that she stopped lisinopril  and has been taking Tums and coughing has improved.      Review of Systems     Physical Exam:     Vitals:    05/20/24 1519   Weight: 46.7 kg (103 lb)   Height: 1.651 m (5' 5)   BMI: 17.14      ENT Physical Exam  Constitutional  Appearance: patient appears well-developed, well-nourished and well-groomed,  Communication/Voice: communication appropriate for developmental age; vocal quality normal;  Head and Face  Appearance: head appears normal, face appears normal and face appears atraumatic;  Palpation: facial palpation normal;  Salivary: glands normal;  Ear  Hearing: intact;  Auricles: right auricle normal; left auricle normal;  External Mastoids: right external mastoid normal; left external mastoid normal;  Ear Canals: right ear canal normal; left ear canal normal;  Tympanic Membranes: right tympanic membrane normal; left tympanic membrane normal;  Nose  External Nose: nares patent bilaterally; external nose normal;  Internal Nose: nasal mucosa normal; septum normal; bilateral inferior turbinates normal;  Oral Cavity/Oropharynx  Lips: normal;  Teeth: normal;  Gums: gingiva normal;  Tongue: normal;  Oral mucosa: normal;  Hard palate: normal;  Neck  Neck: neck normal; neck palpation normal;  Thyroid : thyroid  normal;  Respiratory  Inspection: breathing unlabored; normal breathing rate;  Lymphatic  Palpation: lymph nodes normal;  Neurovestibular  Mental Status: alert and oriented;  Psychiatric: mood  normal; affect is appropriate;  Cranial Nerves: cranial nerves intact;       Assessment and Plan:       ICD-10-CM    1. Dysphagia, unspecified type  R13.10       2. 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 Chronic allergic rhinitis  J30.9       4. Globus sensation  R09.A2         Continue Flonase  for chronic allergic rhinitis.  Continue diet modifications and reflux precautions.       Follow up:  Return if symptoms worsen or fail to improve.    Oneil Arch, DO

## 2024-05-26 ENCOUNTER — Encounter (HOSPITAL_COMMUNITY): Payer: Self-pay

## 2024-05-26 ENCOUNTER — Other Ambulatory Visit: Payer: Self-pay

## 2024-05-26 ENCOUNTER — Ambulatory Visit
Admission: RE | Admit: 2024-05-26 | Discharge: 2024-05-26 | Disposition: A | Discharge status: Home / Self Care | Payer: Self-pay | Source: Ambulatory Visit | Attending: Family Medicine | Admitting: Family Medicine

## 2024-05-26 ENCOUNTER — Ambulatory Visit (HOSPITAL_BASED_OUTPATIENT_CLINIC_OR_DEPARTMENT_OTHER)
Admission: RE | Admit: 2024-05-26 | Discharge: 2024-05-26 | Disposition: A | Payer: Self-pay | Source: Ambulatory Visit | Attending: Family Medicine

## 2024-05-26 DIAGNOSIS — Z780 Asymptomatic menopausal state: Secondary | ICD-10-CM | POA: Insufficient documentation

## 2024-05-26 DIAGNOSIS — M8580 Other specified disorders of bone density and structure, unspecified site: Secondary | ICD-10-CM

## 2024-05-26 DIAGNOSIS — Z1231 Encounter for screening mammogram for malignant neoplasm of breast: Secondary | ICD-10-CM | POA: Insufficient documentation

## 2024-05-27 ENCOUNTER — Ambulatory Visit (INDEPENDENT_AMBULATORY_CARE_PROVIDER_SITE_OTHER): Payer: Self-pay

## 2024-06-03 ENCOUNTER — Telehealth (INDEPENDENT_AMBULATORY_CARE_PROVIDER_SITE_OTHER): Payer: Self-pay | Admitting: Family Medicine

## 2024-06-03 NOTE — Nursing Note (Signed)
 Received call from patient stating that she continues to have some problems with her Losartan . States she is taking  1/2 in the morning and 1/2 at night and it is working but sometime it is giving her horrible pressure  headaches that Tylenol  or anything can not help. At those times her BP will run 167/90's and highest 170/90's. Patient states she is unable to take a beta blocker due to her heart and Lisinopril . Patient requesting advisement from provder. Should she try a different medication? Change dosage?  Dr long and Worth are out of office today and Dr Darra the rest of the week. Please advise.

## 2024-06-03 NOTE — Telephone Encounter (Signed)
 Patient informed. Voices understanding and agreement.

## 2024-06-11 ENCOUNTER — Other Ambulatory Visit: Payer: Self-pay

## 2024-06-11 ENCOUNTER — Encounter (INDEPENDENT_AMBULATORY_CARE_PROVIDER_SITE_OTHER): Payer: Self-pay | Admitting: Family Medicine

## 2024-06-11 ENCOUNTER — Ambulatory Visit: Attending: Family Medicine | Admitting: Family Medicine

## 2024-06-11 VITALS — BP 141/65 | HR 76 | Temp 97.5°F | Resp 16 | Ht 65.0 in | Wt 102.6 lb

## 2024-06-11 DIAGNOSIS — R519 Headache, unspecified: Secondary | ICD-10-CM | POA: Insufficient documentation

## 2024-06-11 DIAGNOSIS — G8929 Other chronic pain: Secondary | ICD-10-CM | POA: Insufficient documentation

## 2024-06-11 DIAGNOSIS — I471 Supraventricular tachycardia, unspecified: Secondary | ICD-10-CM | POA: Insufficient documentation

## 2024-06-11 DIAGNOSIS — I499 Cardiac arrhythmia, unspecified: Secondary | ICD-10-CM | POA: Insufficient documentation

## 2024-06-11 DIAGNOSIS — E039 Hypothyroidism, unspecified: Secondary | ICD-10-CM | POA: Insufficient documentation

## 2024-06-11 DIAGNOSIS — I10 Essential (primary) hypertension: Secondary | ICD-10-CM | POA: Insufficient documentation

## 2024-06-11 DIAGNOSIS — I498 Other specified cardiac arrhythmias: Secondary | ICD-10-CM | POA: Insufficient documentation

## 2024-06-11 DIAGNOSIS — I2129 ST elevation (STEMI) myocardial infarction involving other sites: Secondary | ICD-10-CM | POA: Insufficient documentation

## 2024-06-16 ENCOUNTER — Other Ambulatory Visit (INDEPENDENT_AMBULATORY_CARE_PROVIDER_SITE_OTHER): Payer: Self-pay | Admitting: Family Medicine

## 2024-06-16 DIAGNOSIS — I2129 ST elevation (STEMI) myocardial infarction involving other sites: Secondary | ICD-10-CM

## 2024-06-16 DIAGNOSIS — I471 Supraventricular tachycardia, unspecified: Secondary | ICD-10-CM

## 2024-06-16 DIAGNOSIS — I499 Cardiac arrhythmia, unspecified: Secondary | ICD-10-CM

## 2024-06-17 ENCOUNTER — Telehealth (INDEPENDENT_AMBULATORY_CARE_PROVIDER_SITE_OTHER): Payer: Self-pay | Admitting: Family Medicine

## 2024-06-17 NOTE — Nursing Note (Signed)
 Received call from patient stating she is no taking the Losartan  anymore. States that it makes her heart race and feel weird. States she will go back to the Lisinopril  or an alternative and asks for Dr Darra to send something to her pharmacy

## 2024-06-18 ENCOUNTER — Ambulatory Visit (HOSPITAL_COMMUNITY): Payer: Self-pay

## 2024-06-18 ENCOUNTER — Other Ambulatory Visit (INDEPENDENT_AMBULATORY_CARE_PROVIDER_SITE_OTHER): Payer: Self-pay | Admitting: Family Medicine

## 2024-06-18 MED ORDER — RAMIPRIL 2.5 MG CAPSULE
2.5000 mg | ORAL_CAPSULE | Freq: Every day | ORAL | 0 refills | Status: DC
Start: 2024-06-18 — End: 2024-06-18

## 2024-06-18 MED ORDER — RAMIPRIL 1.25 MG CAPSULE: 1.2500 mg | ORAL_CAPSULE | Freq: Every day | ORAL | 0 refills | Status: DC

## 2024-06-18 NOTE — Telephone Encounter (Signed)
 Patient informed. Voices understanding and agreement. Orders placed.

## 2024-06-29 ENCOUNTER — Ambulatory Visit

## 2024-06-29 ENCOUNTER — Telehealth (INDEPENDENT_AMBULATORY_CARE_PROVIDER_SITE_OTHER): Payer: Self-pay | Admitting: Family Medicine

## 2024-06-29 ENCOUNTER — Other Ambulatory Visit (INDEPENDENT_AMBULATORY_CARE_PROVIDER_SITE_OTHER): Payer: Self-pay | Admitting: Family Medicine

## 2024-06-29 ENCOUNTER — Other Ambulatory Visit: Payer: Self-pay

## 2024-06-29 VITALS — BP 170/70

## 2024-06-29 DIAGNOSIS — Z0130 Encounter for examination of blood pressure without abnormal findings: Secondary | ICD-10-CM

## 2024-06-29 MED ORDER — LISINOPRIL 30 MG TABLET: 30.0000 mg | ORAL_TABLET | Freq: Every day | ORAL | 0 refills | Status: DC

## 2024-06-29 NOTE — Telephone Encounter (Signed)
 Pt says she will not get back on the ramipril . Says she's been taking lisinopril  20 mg since thanksgiving. Pt wants a calcium channel blocker if possible or to go up to 30 mg of lisinopril .

## 2024-06-29 NOTE — Telephone Encounter (Signed)
 Pt came to clinic for BP check. BP was 165/81 and 170/70. Pt says at home it runs in the 170s/90's. Pt asked if you wanted to change her BP meds or add something. Pt c/o head pressure also.

## 2024-06-29 NOTE — Nursing Note (Signed)
 Received call from patient stating that her bottom number on her BP has been stating consistently in the 90's. States she is taking her Lisinopril  20 mg daily as directed as asks if she should increase to twice daily?

## 2024-06-29 NOTE — Telephone Encounter (Signed)
 Medication sent in. Pt is making an appt to see you also.

## 2024-06-30 ENCOUNTER — Other Ambulatory Visit (INDEPENDENT_AMBULATORY_CARE_PROVIDER_SITE_OTHER): Payer: Self-pay | Admitting: Family Medicine

## 2024-06-30 ENCOUNTER — Emergency Department
Admission: EM | Admit: 2024-06-30 | Discharge: 2024-06-30 | Disposition: A | Discharge status: Home / Self Care | Source: Home / Self Care

## 2024-06-30 ENCOUNTER — Emergency Department (HOSPITAL_COMMUNITY)

## 2024-06-30 ENCOUNTER — Encounter (HOSPITAL_COMMUNITY): Payer: Self-pay

## 2024-06-30 DIAGNOSIS — E079 Disorder of thyroid, unspecified: Secondary | ICD-10-CM

## 2024-06-30 DIAGNOSIS — R519 Headache, unspecified: Secondary | ICD-10-CM | POA: Insufficient documentation

## 2024-06-30 DIAGNOSIS — I10 Essential (primary) hypertension: Secondary | ICD-10-CM

## 2024-06-30 LAB — ECG 12 LEAD
Atrial Rate: 63 {beats}/min
Calculated P Axis: 75 degrees
Calculated R Axis: 206 degrees
Calculated T Axis: 87 degrees
PR Interval: 178 ms
QRS Duration: 82 ms
QT Interval: 430 ms
QTC Calculation: 440 ms
Ventricular rate: 63 {beats}/min

## 2024-06-30 LAB — COMPREHENSIVE METABOLIC PANEL, NON-FASTING
ALBUMIN/GLOBULIN RATIO: 1.5 — ABNORMAL HIGH (ref 0.8–1.4)
ALBUMIN: 4.5 g/dL (ref 3.5–5.7)
ALKALINE PHOSPHATASE: 99 U/L (ref 34–104)
ALT (SGPT): 21 U/L (ref 7–52)
AST (SGOT): 29 U/L (ref 13–39)
BILIRUBIN TOTAL: 0.7 mg/dL (ref 0.3–1.0)
BUN/CREA RATIO: 23 — ABNORMAL HIGH (ref 6–22)
BUN: 18 mg/dL (ref 7–25)
CALCIUM, CORRECTED: 9.3 mg/dL (ref 8.9–10.8)
CALCIUM: 9.7 mg/dL (ref 8.6–10.3)
CHLORIDE: 110 mmol/L — ABNORMAL HIGH (ref 98–107)
CO2 TOTAL: 27 mmol/L (ref 21–31)
CREATININE: 0.8 mg/dL (ref 0.60–1.30)
ESTIMATED GFR: 76 mL/min/1.73mˆ2 (ref >59)
GLOBULIN: 3 (ref 2.0–3.5)
GLUCOSE: 92 mg/dL (ref 74–109)
OSMOLALITY, CALCULATED: 275 mosm/kg (ref 270–290)
POTASSIUM: 4.1 mmol/L (ref 3.5–5.1)
PROTEIN TOTAL: 7.5 g/dL (ref 6.4–8.9)
SODIUM: 137 mmol/L (ref 136–145)

## 2024-06-30 LAB — TROPONIN-I
TROPONIN I: 4 ng/L (ref <15)
TROPONIN I: 4 ng/L (ref <15)

## 2024-06-30 LAB — CBC WITH DIFF
BASOPHIL #: 0.1 x10ˆ3/uL (ref 0.00–0.10)
BASOPHIL %: 1 % (ref 0–1)
EOSINOPHIL #: 0.2 x10ˆ3/uL (ref 0.00–0.50)
EOSINOPHIL %: 4 % (ref 1–7)
HCT: 40.6 % (ref 31.2–41.9)
HGB: 13.7 g/dL (ref 10.9–14.3)
LYMPHOCYTE #: 2.7 x10ˆ3/uL (ref 1.10–3.10)
LYMPHOCYTE %: 41 % (ref 16–46)
MCH: 31.9 pg (ref 24.7–32.8)
MCHC: 33.8 g/dL (ref 32.3–35.6)
MCV: 94.5 fL (ref 75.5–95.3)
MONOCYTE #: 0.6 x10ˆ3/uL (ref 0.20–0.90)
MONOCYTE %: 10 % (ref 4–11)
MPV: 7.7 fL — ABNORMAL LOW (ref 7.9–10.8)
NEUTROPHIL #: 3 x10ˆ3/uL (ref 1.90–8.20)
NEUTROPHIL %: 45 % (ref 43–77)
PLATELETS: 253 x10ˆ3/uL (ref 140–440)
RBC: 4.3 x10ˆ6/uL (ref 3.63–4.92)
RDW: 14 % (ref 12.3–17.7)
WBC: 6.6 x10ˆ3/uL (ref 3.8–11.8)

## 2024-06-30 LAB — THYROID STIMULATING HORMONE (SENSITIVE TSH): TSH: 6.3 u[IU]/mL — ABNORMAL HIGH (ref 0.450–5.330)

## 2024-06-30 MED ORDER — CLONIDINE HCL 0.1 MG TABLET
ORAL_TABLET | ORAL | Status: AC
  Filled 2024-06-30: qty 1

## 2024-06-30 MED ORDER — CLONIDINE HCL 0.1 MG TABLET
0.1000 mg | ORAL_TABLET | ORAL | Status: AC
  Administered 2024-06-30: 0.1 mg via ORAL

## 2024-06-30 MED ORDER — LISINOPRIL 20 MG TABLET: 20.0000 mg | ORAL_TABLET | Freq: Two times a day (BID) | ORAL | 0 refills | Status: DC

## 2024-06-30 NOTE — ED Triage Notes (Signed)
 Reports intermittent high blood pressure, has had medication changes through her PCP recently. Currently takes lisinopril  30 mg after seeing her PCP earlier today. States BP has been anywhere between 160-190 systolic. Reports a sudden onset of shakiness, weakness in her legs after taking falling asleep in her chair. Denies headache, CP, or SOB at this time.

## 2024-06-30 NOTE — Discharge Instructions (Signed)
 Continue to monitor symptoms. Take medications as prescribed.  Make sure you are staying hydrated with plenty of fluids.  Get plenty of rest.  Follow-up with your family doctor within 24-48 hours.  Return to ER immediately for any new or worsening symptoms.    Please ensure all questions or concerns are addressed prior to leaving the hospital. We want to make sure your concerns are addressed to make sure you are as safe and healthy as possible. By leaving the hospital, it is understood you are in agreement with your treatment plan.    Thank you for allowing us  to be part of your care.

## 2024-06-30 NOTE — ED Nurses Note (Signed)

## 2024-06-30 NOTE — Telephone Encounter (Signed)
 Patient informed. Voices understanding and agreement.

## 2024-06-30 NOTE — ED Provider Notes (Signed)
 [facility]  ED Primary Provider Note        Arrival: The patient arrived by Private Vehicle     Consent for AI Scribe software [employer] discussed/obtained verbally for encounter with patient    History of Present Illness   chief complaint  Angela Benson is a 78 y.o. female who had concerns including Hypertension, Weakness, and Dizziness.     History of Present Illness  Angela Benson is a 78 year old female with hypertension who presents with elevated blood pressure and dizziness.    She has a history of hypertension with significantly elevated blood pressure readings, reaching approximately 200/100 mmHg. She was on lisinopril  20 mg daily for many years, but it became ineffective about two months ago. She was switched to losartan , which alleviated her Raynaud's symptoms but did not control her blood pressure. Subsequently, she was tried on ramipril , but by Thanksgiving, her blood pressure remained high. She resumed lisinopril  but dose was increased to 30 mg.     She reports occasional headache and dizziness but denies any falls or loss of consciousness.  No shortness of breath, chest pain, nausea, or swelling.    Her past medical history includes arthritis and hypothyroidism, treated with levothyroxine . 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 She describes herself as generally healthy and mentions that she walks frequently.    Review of Systems     No other overt Review of Systems are noted to be positive except noted in the HPI.    Historical Data   History Reviewed This Encounter:     Past Medical/Surgical/Social History  Past Medical History:   Diagnosis Date    Acquired hypothyroidism     B-complex deficiency     Body mass index (BMI) of 19.9 or less in adult     Chronic neck pain     Dextroscoliosis     Essential hypertension     Irritable bowel syndrome     Low back pain     Midline low back pain without sciatica     Non-rheumatic  tricuspid valve insufficiency     Osteopenia     Osteoporosis     Palpitations     Raynaud's syndrome     Retrolisthesis of vertebrae     Sensorineural hearing loss (SNHL) of right ear with unrestricted hearing of left ear     SVT (supraventricular tachycardia) (CMS HCC)     Urinary frequency        Past Surgical History:   Procedure Laterality Date    COLONOSCOPY      HX APPENDECTOMY      HX HIP REPLACEMENT      HX HYSTERECTOMY         Social History     Socioeconomic History    Marital status: Married   Tobacco Use    Smoking status: Never     Passive exposure: Never    Smokeless tobacco: Never   Vaping Use    Vaping status: Never Used   Substance and Sexual Activity    Alcohol  use: Yes     Alcohol /week: 7.0 standard drinks of alcohol      Types: 7 Glasses of wine per week     Comment: Glass of wine every evening.    Drug use: Never     Social Determinants of Health     Transportation Needs: Low Risk (05/02/2023)    Transportation Needs  SDOH Transportation: No   Social Connections: Low Risk (05/02/2023)    Social Connections     SDOH Social Isolation: 5 or more times a week       Allergies  Allergies   Allergen Reactions    Nitrofurantoin Hives/ Urticaria     Flu-like symptoms/hives    Penicillins Hives/ Urticaria     Hives/rash           PHYSICAL EXAM  VITAL SIGNS:  Filed Vitals:    06/30/24 0130 06/30/24 0145 06/30/24 0200 06/30/24 0223   BP: (!) 145/79 132/74 130/74 128/69   Pulse: 54 54 55 71   Resp: 13 16 (!) 21 19   Temp:    36.8 C (98.3 F)   SpO2: 97% 95% 95% 99%     Constitutional: Awake. Thin. Generally healthy appearing. No distress noted.   Cardiovascular: Regular rate. No murmur or gallop heard. No swelling to extremities  Pulmonary/Chest: Breath sounds clear and equal bilaterally. No chest tenderness. No respiratory distress.   Musculoskeletal: No tenderness or deformity. Normal muscle tone and strength.   Skin: warm and dry. No rash, redness, or bruising  Psychiatric: normal mood and affect.  Behavior is normal.   Neurological: Alert, oriented. Normal gait. No focal weakness noted. No sensory deficit. No speech disturbances         Diagnostics    Labs  Results for orders placed or performed during the hospital encounter of 06/30/24   COMPREHENSIVE METABOLIC PANEL, NON-FASTING   Result Value Ref Range    SODIUM 137 136 - 145 mmol/L    POTASSIUM 4.1 3.5 - 5.1 mmol/L    CHLORIDE 110 (H) 98 - 107 mmol/L    CO2 TOTAL 27 21 - 31 mmol/L    BUN 18 7 - 25 mg/dL    CREATININE 9.19 9.39 - 1.30 mg/dL    BUN/CREA RATIO 23 (H) 6 - 22    ESTIMATED GFR 76 >59 mL/min/1.100m^2    ALBUMIN 4.5 3.5 - 5.7 g/dL    CALCIUM 9.7 8.6 - 89.6 mg/dL    GLUCOSE 92 74 - 890 mg/dL    ALKALINE PHOSPHATASE 99 34 - 104 U/L    ALT (SGPT) 21 7 - 52 U/L    AST (SGOT) 29 13 - 39 U/L    BILIRUBIN TOTAL 0.7 0.3 - 1.0 mg/dL    PROTEIN TOTAL 7.5 6.4 - 8.9 g/dL    ALBUMIN/GLOBULIN RATIO 1.5 (H) 0.8 - 1.4    OSMOLALITY, CALCULATED 275 270 - 290 mOsm/kg    CALCIUM, CORRECTED 9.3 8.9 - 10.8 mg/dL    GLOBULIN 3.0 2.0 - 3.5   THYROID  STIMULATING HORMONE (SENSITIVE TSH)   Result Value Ref Range    TSH 6.300 (H) 0.450 - 5.330 uIU/mL   TROPONIN NOW   Result Value Ref Range    TROPONIN I 4 <15 ng/L   TROPONIN IN ONE HOUR   Result Value Ref Range    TROPONIN I 4 <15 ng/L   CBC WITH DIFF   Result Value Ref Range    WBC 6.6 3.8 - 11.8 x10^3/uL    RBC 4.30 3.63 - 4.92 x10^6/uL    HGB 13.7 10.9 - 14.3 g/dL    HCT 59.3 68.7 - 58.0 %    MCV 94.5 75.5 - 95.3 fL    MCH 31.9 24.7 - 32.8 pg    MCHC 33.8 32.3 - 35.6 g/dL    RDW 85.9 87.6 - 82.2 %    PLATELETS 253  140 - 440 x10^3/uL    MPV 7.7 (L) 7.9 - 10.8 fL    NEUTROPHIL % 45 43 - 77 %    LYMPHOCYTE % 41 16 - 46 %    MONOCYTE % 10 4 - 11 %    EOSINOPHIL % 4 1 - 7 %    BASOPHIL % 1 0 - 1 %    NEUTROPHIL # 3.00 1.90 - 8.20 x10^3/uL    LYMPHOCYTE # 2.70 1.10 - 3.10 x10^3/uL    MONOCYTE # 0.60 0.20 - 0.90 x10^3/uL    EOSINOPHIL # 0.20 0.00 - 0.50 x10^3/uL    BASOPHIL # 0.10 0.00 - 0.10 x10^3/uL   ECG 12 LEAD   Result  Value Ref Range    Ventricular rate 63 BPM    Atrial Rate 63 BPM    PR Interval 178 ms    QRS Duration 82 ms    QT Interval 430 ms    QTC Calculation 440 ms    Calculated P Axis 75 degrees    Calculated R Axis 206 degrees    Calculated T Axis 87 degrees       Radiology  Results for orders placed or performed during the hospital encounter of 06/30/24   CT BRAIN WO IV CONTRAST     Status: None    Narrative    Sache T Cancio    RADIOLOGIST: Katelyn A Ziggas    CT BRAIN WO IV CONTRAST performed on 06/30/2024 12:53 AM    CLINICAL HISTORY: Hypertension, headache  hypertension    TECHNIQUE:  Head CT without intravenous contrast.    COMPARISON: None.    FINDINGS:     There is no evidence of acute intracranial hemorrhage, extra-axial collection, or acute, territory infarct. No mass effect or midline shift.    Mild diffuse age-related cerebral atrophy.    No acute, depressed calvarial fracture is identified.    The visualized paranasal sinuses and mastoid air cells are well-aerated.        Impression     IMPRESSION:  No acute intracranial abnormality.          Radiologist location ID: [id number]         Results        Medical Decision Making     Medical Decision Making  78 year old female with a history of hypertension, laryngopharyngeal reflux, Raynaud's phenomenon, hypothyroidism, and osteoarthritis presented with persistently elevated blood pressure despite medication adjustments, associated with dizziness and recent severe headaches but no chest pain, shortness of breath, syncope, or focal neurological deficits. She is currently taking lisinopril  20 mg, recently increased to 30 mg per her primary care provider. Physical exam revealed normal heart and lung sounds, and she denied nausea, swelling, or other acute symptoms. She is otherwise in her usual state of health except for the above complaints.    Hypertensive Urgency  Persistently elevated blood pressure with associated dizziness and headache, no evidence of acute  end-organ damage.  - Order labs to assess current health status  - Order EKG and chest X-ray  - Manage blood pressure in the emergency setting    Medical Decision Making  Amount and/or Complexity of Data Reviewed  Labs: ordered.  Radiology: ordered. Decision-making details documented in ED Course.  ECG/medicine tests: ordered and independent interpretation performed.    Risk  Prescription drug management.        ED Course  ED Course as of 06/30/24 0209   Wed Jun 30, 2024   0051 WBC:  6.6  Normal. PMNs 45   0052 HGB: 13.7  Hct 40.6   0101 CT BRAIN WO IV CONTRAST   IMPRESSION:  No acute intracranial abnormality.     0122 CREATININE: 0.80  GFR 76 BUN 18   0122 BILIRUBIN, TOTAL: 0.7  ALT 21 AST 29 Alk Phos 99   0122 Upon reassessment, patient reports she is feeling better.  States I am not as shaky is denying any headache or dizziness at this time.  Continues to deny any chest pain or palpitations   0132 TROPONIN-I: 4  Normal    0147 TSH(!): 6.300  Slightly elevated    0209 TROPONIN-I: 4  Normal       Medications Ordered/Administered in the ED   cloNIDine  (CATAPRES ) tablet (0.1 mg Oral Given 06/30/24 0053)       Clinical Impression   Hypertension, unspecified type (Primary)   Thyroid  disorder         Following the history, physical exam, and ED workup, the patient was deemed stable and suitable for discharge. The patient/caregiver was advised to return to the ED for any new or worsening symptoms. Discharge medications, and follow-up instructions were discussed with the patient/caregiver in detail, who verbalizes understanding. The patient/caregiver is in agreement and is comfortable with the plan of care.    Disposition: Discharged         Current Discharge Medication List        CONTINUE these medications - NO CHANGES were made during your visit.        Details   acetaminophen  500 mg Tablet  Commonly known as: TYLENOL    500 mg, EVERY 4 HOURS PRN  Refills: 0     cholecalciferol (vitamin D3) 50 mcg (2,000 unit) Tablet    2,000 Units, Daily  Refills: 0     coenzyme Q10 100 mg Capsule   100 mg, Daily  Refills: 0     levothyroxine  50 mcg Tablet  Commonly known as: SYNTHROID   50 mcg, Oral, Daily  Qty: 90 Tablet  Refills: 0     Lisinopril  30 mg Tablet  Commonly known as: PRINIVIL    30 mg, Oral, Daily  Qty: 90 Tablet  Refills: 0     ondansetron  4 mg Tablet, Rapid Dissolve  Commonly known as: ZOFRAN  ODT   4 mg, Oral, EVERY 8 HOURS PRN  Qty: 12 Tablet  Refills: 0            Follow up:   Darra Na, DO  [address]  [place] [address]  [phone]    Schedule an appointment as soon as possible for a visit             Brolin Dambrosia K. Elim Economou- APRN, FNP-C, [facility]  [facility]  Department of Emergency Medicine

## 2024-07-01 ENCOUNTER — Encounter (INDEPENDENT_AMBULATORY_CARE_PROVIDER_SITE_OTHER): Payer: Self-pay | Admitting: Family Medicine

## 2024-07-01 ENCOUNTER — Other Ambulatory Visit: Payer: Self-pay

## 2024-07-01 ENCOUNTER — Telehealth (HOSPITAL_COMMUNITY): Payer: Self-pay | Admitting: Family Medicine

## 2024-07-01 ENCOUNTER — Ambulatory Visit: Attending: Family Medicine | Admitting: Family Medicine

## 2024-07-01 VITALS — BP 161/83 | HR 58 | Temp 97.7°F | Resp 17 | Ht 65.0 in | Wt 104.0 lb

## 2024-07-01 DIAGNOSIS — I517 Cardiomegaly: Secondary | ICD-10-CM

## 2024-07-01 DIAGNOSIS — R9431 Abnormal electrocardiogram [ECG] [EKG]: Secondary | ICD-10-CM

## 2024-07-01 DIAGNOSIS — I10 Essential (primary) hypertension: Secondary | ICD-10-CM | POA: Insufficient documentation

## 2024-07-01 DIAGNOSIS — I7300 Raynaud's syndrome without gangrene: Secondary | ICD-10-CM | POA: Insufficient documentation

## 2024-07-01 DIAGNOSIS — E039 Hypothyroidism, unspecified: Secondary | ICD-10-CM | POA: Insufficient documentation

## 2024-07-01 MED ORDER — CLONIDINE HCL 0.1 MG TABLET: 0.1000 mg | ORAL_TABLET | Freq: Two times a day (BID) | ORAL | 4 refills | Status: DC

## 2024-07-01 MED ORDER — LEVOTHYROXINE 75 MCG TABLET: 75.0000 ug | ORAL_TABLET | Freq: Every morning | ORAL | 4 refills | Status: AC

## 2024-07-01 MED ORDER — AMLODIPINE 2.5 MG TABLET: 2.5000 mg | ORAL_TABLET | Freq: Every day | ORAL | 4 refills | Status: DC

## 2024-07-01 NOTE — Progress Notes (Signed)
 Post Ed Follow-Up    Post ED Follow-Up:   Document completed and/or attempted interactive contact(s) after transition to home after emergency department stay.:   Transition Facility and relevant Date:   Discharge Date: 06/30/24  Discharge from [facility] Emergency Department?: Yes  Discharge Facility: [facility]  Chart reviewed for ED follow up. Call deferred: Patient was seen in office by PCP today. Jackson Lessen, RN         Jackson Lessen, RN

## 2024-07-04 NOTE — Progress Notes (Signed)
 FAMILY MEDICINE, MEDICAL OFFICE BUILDING  [address]  [place] [address]  Operated by [facility]     Name: Angela Benson MRN:  [id number]   Date: 07/01/2024 Age: 78 y.o.          Provider: Layman Law, DO    Reason for visit: Emergency Room Follow Up      History of Present Illness:  07/04/2024:  This 78 year old female follows up now for blood pressure medication change she has a blood pressure log from home which shows the blood pressures are variable.  She was in the emergency room yesterday with the hypertension of 200/100 approximately in his she was dizzy and weak she was given clonidine  which brought her blood pressure down.  EKG and chest x-ray were done CT scan was done of the head the patient went back to lisinopril  20 but it was increased to 30 in his ER labs showed an abnormal TSH and she refused to start taking more Synthroid and wants to wait her losartan  made her heartbeat irregular and she stopped it even though it did help with the Raynaud's I started her on ramipril  but she never increase the dose today her blood pressure is 161/83 her index finger is still slightly blue from Raynaud's in his happens every other week pro she has a appointment on 12/27/2024 with a cardiologist in [facility].  She needs a COVID TDAP an RSV vaccine she has a Holter monitor ordered.  Her hemoglobin was 13.7 hematocrit 40.6 platelet count was normal electrolytes revealed a sodium 137 chloride slightly elevated at 110 glucose 92 BUN 18 creatinine 0.80 TSH jumped up to 6.300 and she has not missed any dose in his continues to take them 1st thing in the morning liver enzymes were normal troponins were four.  Her EKG was read as normal sinus rhythm right superior axis deviation right ventricular hypertrophy and septal infarct age indeterminate.  CT of the brain was done which showed no acute intracranial abnormality there was mild diffuse age-related cerebral atrophy that has no  evidence of acute intracranial hemorrhage extra-axial collection or acute territorial infarct  Historical Data    Past Medical History:  Past Medical History:   Diagnosis Date    Acquired hypothyroidism     B-complex deficiency     Body mass index (BMI) of 19.9 or less in adult     Chronic neck pain     Dextroscoliosis     Essential hypertension     Irritable bowel syndrome     Low back pain     Midline low back pain without sciatica     Non-rheumatic tricuspid valve insufficiency     Osteopenia     Osteoporosis     Palpitations     Raynaud's syndrome     Retrolisthesis of vertebrae     Sensorineural hearing loss (SNHL) of right ear with unrestricted hearing of left ear     SVT (supraventricular tachycardia) (CMS HCC)     Urinary frequency          Past Surgical History:  Past Surgical History:   Procedure Laterality Date    COLONOSCOPY      HX APPENDECTOMY      HX HIP REPLACEMENT      HX HYSTERECTOMY           Allergies:  Allergies[1]  Medications:  Current Outpatient Medications   Medication Sig    acetaminophen  (TYLENOL ) 500 mg Oral Tablet Take 1  Tablet (500 mg total) by mouth Every 4 hours as needed for Pain    amLODIPine  (NORVASC) 2.5 mg Oral Tablet Take 1 Tablet (2.5 mg total) by mouth Daily    calcium carbonate (TUMS) 200 mg calcium (500 mg) Oral Tablet, Chewable Chew 1 Tablet (500 mg total) Once per day as needed for Other    cholecalciferol, vitamin D3, 50 mcg (2,000 unit) Oral Tablet Take 1 Tablet (2,000 Units total) by mouth Daily    cloNIDine  HCL (CATAPRES ) 0.1 mg Oral Tablet Take 1 Tablet (0.1 mg total) by mouth Twice daily Indications: high blood pressure    coenzyme Q10 100 mg Oral Capsule Take 1 Capsule (100 mg total) by mouth Daily    levothyroxine  (SYNTHROID) 75 mcg Oral Tablet Take 1 Tablet (75 mcg total) by mouth Every morning    lisinopriL  (PRINIVIL ) 20 mg Oral Tablet Take 1 Tablet (20 mg total) by mouth Twice daily (Patient taking differently: Take 1.5 Tablets (30 mg total) by mouth Daily)      Family History:  Family Medical History:       Problem Relation (Age of Onset)    Arthritis-osteo Father    Breast Cancer Maternal Aunt    Congestive Heart Failure Mother    Dementia Mother    Diabetes type II Father    Elevated Lipids Father    Heart Attack Father    Hypertension (High Blood Pressure) Mother, Father    Melanoma Father    No Known Problems Sister, Brother, Maternal Grandmother, Maternal Grandfather, Paternal Grandmother, Paternal Grandfather, Daughter, Son, Maternal Uncle, Paternal Aunt, Paternal Uncle, Other            Social History:  Social History     Socioeconomic History    Marital status: Married   Tobacco Use    Smoking status: Never     Passive exposure: Never    Smokeless tobacco: Never   Vaping Use    Vaping status: Never Used   Substance and Sexual Activity    Alcohol  use: Yes     Alcohol /week: 7.0 standard drinks of alcohol      Types: 7 Glasses of wine per week     Comment: Glass of wine every evening.    Drug use: Never     Social Determinants of Health     Transportation Needs: Low Risk (05/02/2023)    Transportation Needs     SDOH Transportation: No   Social Connections: Low Risk (05/02/2023)    Social Connections     SDOH Social Isolation: 5 or more times a week           Review of Systems:  Any pertinent Review of Systems as addressed in the HPI above.    Physical Exam:  Vital Signs:  Vitals:    07/01/24 1507   BP: (!) 161/83   Pulse: 58   Resp: 17   Temp: 36.5 C (97.7 F)   TempSrc: Temporal   SpO2: 100%   Weight: 47.2 kg (104 lb)   Height: 1.651 m (5' 5)   BMI: 17.31     Physical Exam  Vitals and nursing note reviewed.   Constitutional:       General: She is awake.      Appearance: Normal appearance. She is well-developed, well-groomed and underweight.   HENT:      Head: Normocephalic and atraumatic.      Right Ear: Tympanic membrane, ear canal and external ear normal.      Left Ear: Tympanic  membrane, ear canal and external ear normal.      Nose: Nose normal.       Mouth/Throat:      Mouth: Mucous membranes are moist.      Pharynx: Oropharynx is clear.   Eyes:      Extraocular Movements: Extraocular movements intact.      Conjunctiva/sclera: Conjunctivae normal.      Pupils: Pupils are equal, round, and reactive to light.   Cardiovascular:      Rate and Rhythm: Normal rate and regular rhythm.      Pulses:           Carotid pulses are 2+ on the right side and 2+ on the left side.       Radial pulses are 2+ on the right side and 2+ on the left side.        Dorsalis pedis pulses are 2+ on the right side and 2+ on the left side.        Posterior tibial pulses are 2+ on the right side and 2+ on the left side.      Heart sounds: Normal heart sounds, S1 normal and S2 normal.   Pulmonary:      Effort: Pulmonary effort is normal.      Breath sounds: Normal breath sounds. No decreased breath sounds, wheezing, rhonchi or rales.   Abdominal:      General: Abdomen is flat. Bowel sounds are normal.      Palpations: Abdomen is soft.      Tenderness: There is no abdominal tenderness. There is no right CVA tenderness or left CVA tenderness.   Musculoskeletal:         General: Normal range of motion.      Cervical back: Normal range of motion and neck supple.      Right lower leg: No edema.      Left lower leg: No edema.   Skin:     General: Skin is warm and dry.      Capillary Refill: Capillary refill takes less than 2 seconds.   Neurological:      General: No focal deficit present.      Mental Status: She is alert and oriented to person, place, and time. Mental status is at baseline.      Cranial Nerves: Cranial nerves 2-12 are intact.      Sensory: Sensation is intact.      Motor: Motor function is intact.      Coordination: Coordination is intact.      Gait: Gait is intact.   Psychiatric:         Mood and Affect: Mood normal.         Behavior: Behavior normal. Behavior is cooperative.         Thought Content: Thought content normal.         Judgment: Judgment normal.       Assessment:     ICD-10-CM    1. Essential hypertension  I10       2. Acquired hypothyroidism  E03.9       3. Raynaud's disease  I73.00 HEP-2 SUBSTRATE ANTINUCLEAR ANTIBODIES (ANA), SERUM     SEDIMENTATION RATE         Plan:  Orders Placed This Encounter    HEP-2 SUBSTRATE ANTINUCLEAR ANTIBODIES (ANA), SERUM    SEDIMENTATION RATE    cloNIDine  HCL (CATAPRES ) 0.1 mg Oral Tablet    amLODIPine  (NORVASC) 2.5 mg Oral Tablet    levothyroxine  (SYNTHROID) 75  mcg Oral Tablet     We are going to switch the patient to amlodipine  2.5 along with her lisinopril .  I increase the Synthroid 75 mcg which I do not think the patient wants to take.  I refilled her Catapres  0.1 mg which the patient said she tolerated well when her blood pressure went up and I told her to take it if it goes over 160 systolic or 100 diastolic.  Also ordered a hip to a substrate antinuclear antibody screen in his sedimentation rate.  I will see her back in a month.  She will continue keep a record of her blood pressure the log of which I reviewed starting from November 14th till December 3rd.    Return in about 1 month (around 08/01/2024).    Layman Law, DO     Portions of this note may be dictated using voice recognition software or a dictation service. Variances in spelling and vocabulary are possible and unintentional. Not all errors are caught/corrected. Please notify the dino if any discrepancies are noted or if the meaning of any statement is not clear.        [1]   Allergies  Allergen Reactions    Nitrofurantoin Hives/ Urticaria     Flu-like symptoms/hives    Penicillins Hives/ Urticaria     Hives/rash

## 2024-07-05 ENCOUNTER — Telehealth (INDEPENDENT_AMBULATORY_CARE_PROVIDER_SITE_OTHER): Payer: Self-pay | Admitting: Family Medicine

## 2024-07-05 NOTE — Telephone Encounter (Signed)
 Vmail left to return call.

## 2024-07-05 NOTE — Nursing Note (Signed)
 Received call from patient who states that she has been taking her Lisinopril  20 mg (1.5 tabs to make 30 mg) and Amlodipine  2.5mg  and she is still having problems with her BP and having headaches. States her BP yesterday was 170/90's. Patient states that  yesterday she tried just taking the Amlodipine  and the headache went away but her BP is still the same this morning of 170/90 and asks should she try Losartan  with the Amlodipine  and if so what mg of each? Please advise.

## 2024-07-06 ENCOUNTER — Ambulatory Visit (INDEPENDENT_AMBULATORY_CARE_PROVIDER_SITE_OTHER): Payer: Self-pay | Admitting: Family Medicine

## 2024-07-06 NOTE — Telephone Encounter (Signed)
 Patient states she will gladly take the Amlodipine  5mg  but patient asks if she can still take the low dose Losartan  with it. Stats when she takes the Losartan  12.5-25 mg her BP stays at 122/85 area and she feels great. But that she will do whatever Dr Darra thinks is best

## 2024-07-06 NOTE — Telephone Encounter (Signed)
 Patient informed. Voices understanding and agreement.

## 2024-07-26 ENCOUNTER — Other Ambulatory Visit (INDEPENDENT_AMBULATORY_CARE_PROVIDER_SITE_OTHER): Payer: Self-pay | Admitting: Family Medicine

## 2024-07-26 DIAGNOSIS — E039 Hypothyroidism, unspecified: Secondary | ICD-10-CM

## 2024-07-28 ENCOUNTER — Other Ambulatory Visit: Payer: Self-pay

## 2024-07-28 ENCOUNTER — Ambulatory Visit: Attending: Family Medicine

## 2024-07-28 DIAGNOSIS — I7300 Raynaud's syndrome without gangrene: Secondary | ICD-10-CM | POA: Insufficient documentation

## 2024-07-28 DIAGNOSIS — E039 Hypothyroidism, unspecified: Secondary | ICD-10-CM | POA: Insufficient documentation

## 2024-07-28 LAB — THYROID STIMULATING HORMONE (SENSITIVE TSH): TSH: 2.057 u[IU]/mL (ref 0.450–5.330)

## 2024-07-28 LAB — SEDIMENTATION RATE: ERYTHROCYTE SEDIMENTATION RATE (ESR): 13 mm/h (ref <30)

## 2024-07-29 LAB — HEP-2 SUBSTRATE ANTINUCLEAR ANTIBODIES (ANA), SERUM: ANA INTERPRETATION: NEGATIVE

## 2024-08-03 ENCOUNTER — Ambulatory Visit (INDEPENDENT_AMBULATORY_CARE_PROVIDER_SITE_OTHER): Payer: Self-pay

## 2024-08-03 ENCOUNTER — Encounter (INDEPENDENT_AMBULATORY_CARE_PROVIDER_SITE_OTHER): Payer: Self-pay | Admitting: Family Medicine

## 2024-08-05 ENCOUNTER — Ambulatory Visit (INDEPENDENT_AMBULATORY_CARE_PROVIDER_SITE_OTHER): Payer: Self-pay | Admitting: Family Medicine

## 2024-08-16 ENCOUNTER — Other Ambulatory Visit (INDEPENDENT_AMBULATORY_CARE_PROVIDER_SITE_OTHER): Payer: Self-pay | Admitting: Family Medicine

## 2024-08-16 MED ORDER — AMLODIPINE 5 MG TABLET: 5.0000 mg | ORAL_TABLET | Freq: Every day | ORAL | 1 refills | Status: AC

## 2024-08-19 ENCOUNTER — Other Ambulatory Visit: Payer: Self-pay

## 2024-08-19 ENCOUNTER — Ambulatory Visit: Attending: Family Medicine | Admitting: Family Medicine

## 2024-08-19 ENCOUNTER — Encounter (INDEPENDENT_AMBULATORY_CARE_PROVIDER_SITE_OTHER): Payer: Self-pay | Admitting: Family Medicine

## 2024-08-19 VITALS — BP 143/75 | HR 59 | Temp 97.3°F | Resp 16 | Ht 65.0 in | Wt 104.0 lb

## 2024-08-19 DIAGNOSIS — I7300 Raynaud's syndrome without gangrene: Secondary | ICD-10-CM | POA: Insufficient documentation

## 2024-08-19 DIAGNOSIS — E039 Hypothyroidism, unspecified: Secondary | ICD-10-CM | POA: Insufficient documentation

## 2024-08-19 DIAGNOSIS — I10 Essential (primary) hypertension: Secondary | ICD-10-CM | POA: Insufficient documentation

## 2024-08-19 DIAGNOSIS — I499 Cardiac arrhythmia, unspecified: Secondary | ICD-10-CM | POA: Insufficient documentation

## 2024-08-19 DIAGNOSIS — I471 Supraventricular tachycardia, unspecified: Secondary | ICD-10-CM | POA: Insufficient documentation

## 2024-08-19 NOTE — Nursing Note (Signed)
 08/19/24 1401   Fall Risk Assessment   Do you feel unsteady when standing or walking? No   Do you worry about falling? Yes   Have you fallen in the past year? No

## 2024-08-19 NOTE — Nursing Note (Signed)
 08/19/24 1400   Recent Weight Change   Have you had a recent unexplained weight loss or gain? N   Health Education and Literacy   How often do you have a problem understanding what is told to you about your medical condition?  Never   Domestic Violence   Because we are aware of abuse and domestic violence today, we ask all patients: Are you being hurt, hit, or frightened by anyone at your home or in your life?  N   Basic Needs   Do you have any basic needs within your home that are not being met? (such as Food, Shelter, Museum/gallery Curator, paying for bills and/or medications) N   Advanced Directives   Do you have any advanced directives? Living Will & MPOA   Do you have the Advanced Directive(s) so we can scan them to your chart? Y

## 2024-08-19 NOTE — Progress Notes (Signed)
 FAMILY MEDICINE, MEDICAL OFFICE BUILDING  [address]  [place] [address]  Operated by [facility]     Name: Angela Benson MRN:  [id number]   Date: 08/19/2024 Age: 79 y.o.          Provider: Layman Law, DO    Reason for visit: Follow-up After Testing      History of Present Illness:  08/19/2024:  This 79 year old female returns for 1 month follow-up stating she feels a lot better headaches are improving Raynaud's is almost completely gone she discontinue the lisinopril  and did much better once we switched her over to amlodipine .  She is still takes a small amount of losartan  however she reviewed her blood pressure log from home with me shows her blood pressures are much better at home.  She had an eye exam 2 days ago no vision changes no cataracts no increasing pressor he is she is taking Synthroid 50 mcg and doing well with that in his last TSH was normal at 2.057 she takes the thyroid  medication 1st thing in the morning her sedimentation rate was 13 ANA was negative therefore it does not appear to be any rheumatological disorders.  Historical Data    Past Medical History:  Past Medical History:   Diagnosis Date    Acquired hypothyroidism     B-complex deficiency     Body mass index (BMI) of 19.9 or less in adult     Chronic neck pain     Dextroscoliosis     Essential hypertension     Irritable bowel syndrome     Low back pain     Midline low back pain without sciatica     Non-rheumatic tricuspid valve insufficiency     Osteopenia     Osteoporosis     Palpitations     Raynaud's syndrome     Retrolisthesis of vertebrae     Sensorineural hearing loss (SNHL) of right ear with unrestricted hearing of left ear     SVT (supraventricular tachycardia) (CMS HCC)     Urinary frequency          Past Surgical History:  Past Surgical History:   Procedure Laterality Date    COLONOSCOPY      HX APPENDECTOMY      HX HIP REPLACEMENT      HX HYSTERECTOMY            Allergies:  Allergies[1]  Medications:  Current Outpatient Medications   Medication Sig    acetaminophen  (TYLENOL ) 500 mg Oral Tablet Take 1 Tablet (500 mg total) by mouth Every 4 hours as needed for Pain    amLODIPine  (NORVASC) 5 mg Oral Tablet Take 1 Tablet (5 mg total) by mouth Daily    calcium carbonate (TUMS) 200 mg calcium (500 mg) Oral Tablet, Chewable Chew 1 Tablet (500 mg total) Once per day as needed for Other    cholecalciferol, vitamin D3, 50 mcg (2,000 unit) Oral Tablet Take 1 Tablet (2,000 Units total) by mouth Daily    coenzyme Q10 100 mg Oral Capsule Take 1 Capsule (100 mg total) by mouth Daily    famotidine  (PEPCID ) 10 mg Oral Tablet Take 1 Tablet (10 mg total) by mouth Twice daily    levothyroxine  (SYNTHROID) 75 mcg Oral Tablet Take 1 Tablet (75 mcg total) by mouth Every morning (Patient taking differently: Take 50 mcg by mouth Every morning)     Family History:  Family Medical History:       Problem  Relation (Age of Onset)    Arthritis-osteo Father    Breast Cancer Maternal Aunt    Congestive Heart Failure Mother    Dementia Mother    Diabetes type II Father    Elevated Lipids Father    Heart Attack Father    Hypertension (High Blood Pressure) Mother, Father    Melanoma Father    No Known Problems Sister, Brother, Maternal Grandmother, Maternal Grandfather, Paternal Grandmother, Paternal Grandfather, Daughter, Son, Maternal Uncle, Paternal Aunt, Paternal Uncle, Other            Social History:  Social History     Socioeconomic History    Marital status: Married   Tobacco Use    Smoking status: Never     Passive exposure: Never    Smokeless tobacco: Never   Vaping Use    Vaping status: Never Used   Substance and Sexual Activity    Alcohol  use: Yes     Alcohol /week: 7.0 standard drinks of alcohol      Types: 7 Glasses of wine per week     Comment: Glass of wine every evening.    Drug use: Never     Social Determinants of Health     Transportation Needs: Low Risk (05/02/2023)    Transportation  Needs     SDOH Transportation: No   Social Connections: Low Risk (05/02/2023)    Social Connections     SDOH Social Isolation: 5 or more times a week           Review of Systems:  Any pertinent Review of Systems as addressed in the HPI above.    Physical Exam:  Vital Signs:  Vitals:    08/19/24 1405 08/19/24 1408   BP: (!) 150/68 (!) 143/75   Pulse: 59    Resp: 16    Temp: 36.3 C (97.3 F)    TempSrc: Temporal    SpO2: 100%    Weight: 47.2 kg (104 lb)    Height: 1.651 m (5' 5)    BMI: 17.31      Physical Exam  Vitals and nursing note reviewed.   Constitutional:       General: She is awake.      Appearance: Normal appearance. She is well-developed, well-groomed and underweight.   HENT:      Head: Normocephalic and atraumatic.      Right Ear: Tympanic membrane, ear canal and external ear normal.      Left Ear: Tympanic membrane, ear canal and external ear normal.      Nose: Nose normal.      Mouth/Throat:      Mouth: Mucous membranes are moist.      Pharynx: Oropharynx is clear.   Eyes:      Extraocular Movements: Extraocular movements intact.      Conjunctiva/sclera: Conjunctivae normal.      Pupils: Pupils are equal, round, and reactive to light.   Cardiovascular:      Rate and Rhythm: Normal rate and regular rhythm.      Pulses:           Carotid pulses are 2+ on the right side and 2+ on the left side.       Radial pulses are 2+ on the right side and 2+ on the left side.        Dorsalis pedis pulses are 2+ on the right side and 2+ on the left side.        Posterior tibial pulses are 2+ on  the right side and 2+ on the left side.      Heart sounds: Normal heart sounds, S1 normal and S2 normal.   Pulmonary:      Effort: Pulmonary effort is normal.      Breath sounds: Normal breath sounds. No decreased breath sounds, wheezing, rhonchi or rales.   Abdominal:      General: Abdomen is flat. Bowel sounds are normal.      Palpations: Abdomen is soft.      Tenderness: There is no abdominal tenderness. There is no right CVA  tenderness or left CVA tenderness.   Musculoskeletal:         General: Normal range of motion.      Cervical back: Normal range of motion and neck supple.      Right lower leg: No edema.      Left lower leg: No edema.   Skin:     General: Skin is warm and dry.      Capillary Refill: Capillary refill takes less than 2 seconds.   Neurological:      General: No focal deficit present.      Mental Status: She is alert and oriented to person, place, and time. Mental status is at baseline.      Cranial Nerves: Cranial nerves 2-12 are intact.      Sensory: Sensation is intact.      Motor: Motor function is intact.      Coordination: Coordination is intact.      Gait: Gait is intact.   Psychiatric:         Mood and Affect: Mood normal.         Behavior: Behavior normal. Behavior is cooperative.         Thought Content: Thought content normal.         Judgment: Judgment normal.       Assessment:    ICD-10-CM    1. SVT (supraventricular tachycardia) (CMS HCC)  I47.10       2. Essential hypertension  I10 THYROXINE, FREE (FREE T4)     THYROID  STIMULATING HORMONE (SENSITIVE TSH)     BASIC METABOLIC PANEL      3. Irregular heart beat  I49.9       4. Raynaud's disease  I73.00       5. Acquired hypothyroidism  E03.9 THYROXINE, FREE (FREE T4)     THYROID  STIMULATING HORMONE (SENSITIVE TSH)     BASIC METABOLIC PANEL         Plan:  Orders Placed This Encounter    THYROXINE, FREE (FREE T4)    THYROID  STIMULATING HORMONE (SENSITIVE TSH)    BASIC METABOLIC PANEL     We are going to see the patient back in 3 months she will continue to monitor blood pressure closely in 3 months we will repeat it free T4 TSH and a basic metabolic panel.  The patient should stay on a heart healthy low fat low-cholesterol diet and try to avoid high fructose corn syrup and concentrated sweets.  She should continue to exercise on a routine basis.    Return in about 3 months (around 11/17/2024).    Layman Law, DO     Portions of this note may be dictated using  voice recognition software or a dictation service. Variances in spelling and vocabulary are possible and unintentional. Not all errors are caught/corrected. Please notify the dino if any discrepancies are noted or if the meaning of any statement is not clear.        [  1]   Allergies  Allergen Reactions    Nitrofurantoin Hives/ Urticaria     Flu-like symptoms/hives    Penicillins Hives/ Urticaria     Hives/rash

## 2024-11-22 ENCOUNTER — Ambulatory Visit (INDEPENDENT_AMBULATORY_CARE_PROVIDER_SITE_OTHER): Payer: Self-pay | Admitting: Family Medicine

## 2024-12-07 ENCOUNTER — Ambulatory Visit (INDEPENDENT_AMBULATORY_CARE_PROVIDER_SITE_OTHER): Payer: Self-pay | Admitting: Family Medicine
